# Patient Record
Sex: Female | Born: 1965 | Race: Black or African American | Hispanic: No | Marital: Single | Smoking: Former smoker
Health system: Southern US, Community
[De-identification: ages and names within clinical notes are randomized; demographics above are authoritative.]

---

## 2012-11-06 ENCOUNTER — Other Ambulatory Visit (HOSPITAL_BASED_OUTPATIENT_CLINIC_OR_DEPARTMENT_OTHER): Payer: Self-pay | Admitting: Orthopedic Surgery

## 2012-11-06 DIAGNOSIS — M25511 Pain in right shoulder: Secondary | ICD-10-CM

## 2012-11-15 ENCOUNTER — Ambulatory Visit (HOSPITAL_BASED_OUTPATIENT_CLINIC_OR_DEPARTMENT_OTHER): Payer: PRIVATE HEALTH INSURANCE

## 2012-11-15 ENCOUNTER — Ambulatory Visit (HOSPITAL_BASED_OUTPATIENT_CLINIC_OR_DEPARTMENT_OTHER)
Admission: RE | Admit: 2012-11-15 | Discharge: 2012-11-15 | Disposition: A | Discharge status: Home / Self Care | Payer: PRIVATE HEALTH INSURANCE | Source: Ambulatory Visit | Attending: Orthopedic Surgery | Admitting: Orthopedic Surgery

## 2012-11-15 DIAGNOSIS — M25511 Pain in right shoulder: Secondary | ICD-10-CM

## 2012-11-28 ENCOUNTER — Other Ambulatory Visit: Payer: PRIVATE HEALTH INSURANCE

## 2013-03-04 ENCOUNTER — Other Ambulatory Visit (HOSPITAL_BASED_OUTPATIENT_CLINIC_OR_DEPARTMENT_OTHER): Payer: Self-pay | Admitting: Orthopedic Surgery

## 2013-03-04 DIAGNOSIS — M25511 Pain in right shoulder: Secondary | ICD-10-CM

## 2013-03-04 DIAGNOSIS — M25552 Pain in left hip: Secondary | ICD-10-CM

## 2013-03-07 ENCOUNTER — Other Ambulatory Visit: Payer: PRIVATE HEALTH INSURANCE

## 2013-03-14 ENCOUNTER — Other Ambulatory Visit: Payer: PRIVATE HEALTH INSURANCE

## 2013-03-20 ENCOUNTER — Inpatient Hospital Stay: Admission: RE | Admit: 2013-03-20 | Payer: PRIVATE HEALTH INSURANCE | Source: Ambulatory Visit

## 2013-04-04 ENCOUNTER — Ambulatory Visit
Admission: RE | Admit: 2013-04-04 | Discharge: 2013-04-04 | Disposition: A | Discharge status: Home / Self Care | Payer: PRIVATE HEALTH INSURANCE | Source: Ambulatory Visit | Attending: Orthopedic Surgery | Admitting: Orthopedic Surgery

## 2013-04-04 ENCOUNTER — Ambulatory Visit
Admission: RE | Admit: 2013-04-04 | Discharge: 2013-04-04 | Disposition: A | Discharge status: Home / Self Care | Payer: PRIVATE HEALTH INSURANCE | Source: Ambulatory Visit

## 2013-04-04 ENCOUNTER — Ambulatory Visit: Admission: RE | Admit: 2013-04-04 | Payer: PRIVATE HEALTH INSURANCE | Source: Ambulatory Visit

## 2013-04-04 ENCOUNTER — Other Ambulatory Visit: Payer: Self-pay | Admitting: Orthopedic Surgery

## 2013-04-04 ENCOUNTER — Ambulatory Visit: Payer: PRIVATE HEALTH INSURANCE

## 2013-04-04 DIAGNOSIS — R52 Pain, unspecified: Secondary | ICD-10-CM

## 2013-04-04 DIAGNOSIS — M25511 Pain in right shoulder: Secondary | ICD-10-CM

## 2013-04-04 DIAGNOSIS — M25552 Pain in left hip: Secondary | ICD-10-CM

## 2013-04-04 NOTE — Progress Notes (Signed)
Dana Orozco had a difficult time getting double study done today.  She arrived an hour after requested arrival time, was claustrophobic, aggravated at physician for sending her and upset that her sister died recently.  Near the end of her study she started yelling so she was taken out of the scanner and sent home.

## 2013-06-24 ENCOUNTER — Ambulatory Visit (INDEPENDENT_AMBULATORY_CARE_PROVIDER_SITE_OTHER): Payer: PRIVATE HEALTH INSURANCE | Admitting: Podiatry

## 2013-06-24 ENCOUNTER — Encounter: Payer: Self-pay | Admitting: Podiatry

## 2013-06-24 VITALS — BP 145/99 | HR 95 | Ht 62.0 in | Wt 230.0 lb

## 2013-06-24 DIAGNOSIS — B351 Tinea unguium: Secondary | ICD-10-CM

## 2013-06-24 DIAGNOSIS — M79609 Pain in unspecified limb: Secondary | ICD-10-CM

## 2013-06-24 DIAGNOSIS — M79673 Pain in unspecified foot: Secondary | ICD-10-CM

## 2013-06-24 DIAGNOSIS — L851 Acquired keratosis [keratoderma] palmaris et plantaris: Secondary | ICD-10-CM

## 2013-06-24 NOTE — Patient Instructions (Signed)
Seen for painful feet. All calluses and nails debrided. Return in 3 months or as needed.

## 2013-06-24 NOTE — Progress Notes (Signed)
Subjective: 48 year old female presents complaining of pain on left foot and difficulty trimming her nails. She has right hip replacement done in 2008 and having difficulty bending down. Also had foot surgery done [facility] to have foot lesion removed. That area has callus build up.   Objective: Dermatologic: Thick keratotic lesion about 1.5cm in diameter with irregular border under the 5th MPJ area left foot.  Also has plantar broad callus under the ball of right foot.  Has old incision along the 5th Metatarsal shaft in dorsum left foot. Hypertrophic nails x 10.  Orthopedic: Rectus foot without gross deformity. Neurologic: All epicritic and tactile sensations grossly intact.   Assessment: Intractable porokeratosis sub 5 left. Onychomycosis x 10.  Plan: Reviewed the findings and debrided all calluses and nails. Patient will return in 3 months or as needed.

## 2013-08-04 ENCOUNTER — Encounter: Payer: Self-pay | Admitting: Podiatry

## 2013-08-04 ENCOUNTER — Ambulatory Visit (INDEPENDENT_AMBULATORY_CARE_PROVIDER_SITE_OTHER): Payer: PRIVATE HEALTH INSURANCE | Admitting: Podiatry

## 2013-08-04 VITALS — BP 141/95 | HR 78 | Ht 62.0 in | Wt 230.0 lb

## 2013-08-04 DIAGNOSIS — M79606 Pain in leg, unspecified: Secondary | ICD-10-CM

## 2013-08-04 DIAGNOSIS — M79609 Pain in unspecified limb: Secondary | ICD-10-CM

## 2013-08-04 DIAGNOSIS — B351 Tinea unguium: Secondary | ICD-10-CM

## 2013-08-04 DIAGNOSIS — L600 Ingrowing nail: Secondary | ICD-10-CM

## 2013-08-04 NOTE — Patient Instructions (Signed)
Seen for hypertrophic nails and painful calluses. All nails and calluses debrided. Return in 3 months or as needed.  

## 2013-08-04 NOTE — Progress Notes (Signed)
Subjective:  48 year old female presents complaining of pain on left foot and difficulty trimming her nails. She has right hip replacement done in 2008 and having difficulty bending down.  Having difficulty walking due to pain in left foot.   Objective: Dermatologic: Thick keratotic lesion about 1.5cm in diameter with irregular border under the 5th MPJ area left foot.  Also has plantar broad callus under the ball of right foot.  Hypertrophic nails x 10.  Orthopedic: Rectus foot without gross deformity.  Neurologic: All epicritic and tactile sensations grossly intact.   Assessment: Intractable porokeratosis sub 5 left.  Onychomycosis x 10.   Plan: Reviewed the findings and debrided all calluses and nails.  Patient will return in 3 months or as needed.

## 2013-10-12 ENCOUNTER — Encounter: Payer: Self-pay | Admitting: Podiatry

## 2013-10-12 ENCOUNTER — Ambulatory Visit (INDEPENDENT_AMBULATORY_CARE_PROVIDER_SITE_OTHER): Payer: PRIVATE HEALTH INSURANCE | Admitting: Podiatry

## 2013-10-12 VITALS — BP 118/77 | HR 89 | Ht 62.0 in | Wt 240.0 lb

## 2013-10-12 DIAGNOSIS — L600 Ingrowing nail: Secondary | ICD-10-CM

## 2013-10-12 DIAGNOSIS — L851 Acquired keratosis [keratoderma] palmaris et plantaris: Secondary | ICD-10-CM

## 2013-10-12 DIAGNOSIS — M79606 Pain in leg, unspecified: Secondary | ICD-10-CM

## 2013-10-12 DIAGNOSIS — M79609 Pain in unspecified limb: Secondary | ICD-10-CM

## 2013-10-12 DIAGNOSIS — B351 Tinea unguium: Secondary | ICD-10-CM

## 2013-10-12 NOTE — Progress Notes (Signed)
Subjective:  48 year old female presents complaining of pain on left foot under the mid foot area left foot and ball of right foot with thick painful callus and difficulty trimming her nails. Nails are thick and long hurting to wear closed in shoes.   Objective:  Dermatologic: Thick keratotic lesion about 1.5cm in diameter with irregular border under the 5th MPJ area left foot.  Also has plantar broad callus under the ball of right foot.  All nails are hypertrophic and painful x 10.  Orthopedic: Rectus foot without gross deformity.  Neurologic: All epicritic and tactile sensations grossly intact.   Assessment: Intractable porokeratosis sub 5 left.  Onychomycosis x 10.  Ingrown nails both great toes.   Plan: Reviewed the findings and debrided all calluses and nails.  Patient will return in 3 months or as needed.

## 2013-10-12 NOTE — Patient Instructions (Signed)
Seen for painful callus and nails. All debrided.

## 2013-11-04 ENCOUNTER — Ambulatory Visit: Payer: PRIVATE HEALTH INSURANCE | Admitting: Podiatry

## 2013-11-13 ENCOUNTER — Ambulatory Visit: Payer: PRIVATE HEALTH INSURANCE | Admitting: Podiatry

## 2013-11-13 ENCOUNTER — Ambulatory Visit (INDEPENDENT_AMBULATORY_CARE_PROVIDER_SITE_OTHER): Payer: PRIVATE HEALTH INSURANCE | Admitting: Podiatry

## 2013-11-13 DIAGNOSIS — L851 Acquired keratosis [keratoderma] palmaris et plantaris: Secondary | ICD-10-CM

## 2013-11-13 DIAGNOSIS — M79609 Pain in unspecified limb: Secondary | ICD-10-CM

## 2013-11-13 DIAGNOSIS — L600 Ingrowing nail: Secondary | ICD-10-CM

## 2013-11-13 DIAGNOSIS — B351 Tinea unguium: Secondary | ICD-10-CM

## 2013-11-13 DIAGNOSIS — M79606 Pain in leg, unspecified: Secondary | ICD-10-CM

## 2013-11-13 NOTE — Progress Notes (Signed)
Subjective:  48 year old female presents complaining of pain on 2nd toe nails both feet, left foot under the mid foot area left foot and ball of right foot with thick painful callus and difficulty trimming her nails.   Objective:  Dermatologic: Thick keratotic lesion about 1.5cm in diameter with irregular border under the 5th MPJ area left foot.  Also has plantar broad callus under the ball of right foot.  All nails are hypertrophic and painful x 10.  Orthopedic: Rectus foot without gross deformity. Long 2nd toe with pain in toe nails.  Neurologic: All epicritic and tactile sensations grossly intact.   Assessment: Intractable porokeratosis sub 5 left.  Onychomycosis x 10.  Ingrown nails both great toes.  Painful long and hammer toe 2nd bilateral.  Plan: Reviewed the findings and debrided all calluses and nails.  Patient will return in 3 months or as needed.

## 2013-11-13 NOTE — Patient Instructions (Signed)
Seen for painful nails and calluses. All debrided. Return as needed.

## 2014-01-01 ENCOUNTER — Encounter: Payer: Self-pay | Admitting: Podiatry

## 2014-01-01 ENCOUNTER — Ambulatory Visit (INDEPENDENT_AMBULATORY_CARE_PROVIDER_SITE_OTHER): Payer: PRIVATE HEALTH INSURANCE | Admitting: Podiatry

## 2014-01-01 VITALS — BP 140/80 | HR 91

## 2014-01-01 DIAGNOSIS — M79606 Pain in leg, unspecified: Secondary | ICD-10-CM

## 2014-01-01 DIAGNOSIS — L600 Ingrowing nail: Secondary | ICD-10-CM

## 2014-01-01 DIAGNOSIS — L851 Acquired keratosis [keratoderma] palmaris et plantaris: Secondary | ICD-10-CM

## 2014-01-01 DIAGNOSIS — M79609 Pain in unspecified limb: Secondary | ICD-10-CM

## 2014-01-01 DIAGNOSIS — B351 Tinea unguium: Secondary | ICD-10-CM

## 2014-01-01 NOTE — Patient Instructions (Signed)
Seen for hypertrophic nails and painful calluses. All nails and calluses debrided. Return in 2 months or as needed.  

## 2014-01-01 NOTE — Progress Notes (Signed)
Subjective:  48 year old female presents complaining of painful calluses and toe nails. They are painful and difficult to walk on.   Objective:  Dermatologic: Thick keratotic lesion about 1.5cm in diameter with irregular border under the 5th MPJ area left foot.  Also has plantar broad callus under the ball of right foot.  All nails are hypertrophic and painful x 10.  Orthopedic: Rectus foot without gross deformity. Long 2nd toe with pain in toe nails.  Neurologic: All epicritic and tactile sensations grossly intact.   Assessment: Intractable porokeratosis sub 5 left.  Onychomycosis x 10.  Painful long and hammer toe 2nd bilateral.   Plan: Reviewed the findings and debrided all calluses and nails.  Patient will return in 2 months or as needed.

## 2014-02-03 ENCOUNTER — Ambulatory Visit: Payer: PRIVATE HEALTH INSURANCE | Admitting: Podiatry

## 2014-02-03 ENCOUNTER — Encounter: Payer: Self-pay | Admitting: Podiatry

## 2014-02-03 ENCOUNTER — Ambulatory Visit (INDEPENDENT_AMBULATORY_CARE_PROVIDER_SITE_OTHER): Payer: PRIVATE HEALTH INSURANCE | Admitting: Podiatry

## 2014-02-03 VITALS — BP 140/84 | HR 93

## 2014-02-03 DIAGNOSIS — L851 Acquired keratosis [keratoderma] palmaris et plantaris: Secondary | ICD-10-CM

## 2014-02-03 DIAGNOSIS — M79609 Pain in unspecified limb: Secondary | ICD-10-CM

## 2014-02-03 DIAGNOSIS — M79606 Pain in leg, unspecified: Secondary | ICD-10-CM

## 2014-02-03 NOTE — Patient Instructions (Signed)
Seen for hypertrophic nails and painful calluses. All nails and calluses debrided. Return in 2 months or as needed.  

## 2014-02-03 NOTE — Progress Notes (Signed)
Subjective:  48 year old female presents complaining of painful calluses and toe nails. They are painful and difficult to walk on.  No new problems.   Objective:  Dermatologic: Thick keratotic lesion about 1.5cm in diameter with irregular border under the 5th MPJ area left foot.  Also has plantar broad callus under the ball of right foot.  All nails are hypertrophic and painful x 10.  Orthopedic: Rectus foot without gross deformity. Long 2nd toe with pain in toe nails.  Neurologic: All epicritic and tactile sensations grossly intact.   Assessment: Intractable porokeratosis sub 5 left.  Onychomycosis x 10.  Painful long and hammer toe 2nd bilateral.   Plan: Reviewed the findings and debrided all calluses and nails.  Patient will return in 2 months or as needed.

## 2014-03-03 ENCOUNTER — Ambulatory Visit: Payer: PRIVATE HEALTH INSURANCE | Admitting: Podiatry

## 2014-03-26 ENCOUNTER — Encounter: Payer: Self-pay | Admitting: Podiatry

## 2014-03-26 ENCOUNTER — Ambulatory Visit (INDEPENDENT_AMBULATORY_CARE_PROVIDER_SITE_OTHER): Payer: PRIVATE HEALTH INSURANCE | Admitting: Podiatry

## 2014-03-26 VITALS — BP 138/93 | HR 80

## 2014-03-26 DIAGNOSIS — B351 Tinea unguium: Secondary | ICD-10-CM

## 2014-03-26 DIAGNOSIS — M79606 Pain in leg, unspecified: Secondary | ICD-10-CM

## 2014-03-26 DIAGNOSIS — L851 Acquired keratosis [keratoderma] palmaris et plantaris: Secondary | ICD-10-CM

## 2014-03-26 DIAGNOSIS — L600 Ingrowing nail: Secondary | ICD-10-CM

## 2014-03-26 NOTE — Patient Instructions (Signed)
Seen for hypertrophic nails and calluses. All nails and calluses debrided. Return as needed.  

## 2014-03-26 NOTE — Progress Notes (Signed)
Subjective:  48 year old female presents complaining of painful calluses and toe nails.  When they are thick and grown in, feet hurt too much to walk.   Objective:  Dermatologic: Thick keratotic lesion about 1.5cm in diameter with irregular border under the 5th MPJ area left foot.  Also has plantar broad callus under the ball of right foot.  All nails are hypertrophic and painful x 10.  Orthopedic: Rectus foot without gross deformity. Long 2nd toe with pain in toe nails.  Neurologic: All epicritic and tactile sensations grossly intact.   Assessment: Intractable porokeratosis sub 5 left.  Onychomycosis x 10.  Painful long and hammer toe 2nd bilateral.   Plan: Reviewed the findings and debrided all calluses and nails.  Return as needed

## 2014-05-14 ENCOUNTER — Encounter: Payer: Self-pay | Admitting: Podiatry

## 2014-05-14 ENCOUNTER — Ambulatory Visit (INDEPENDENT_AMBULATORY_CARE_PROVIDER_SITE_OTHER): Payer: PRIVATE HEALTH INSURANCE | Admitting: Podiatry

## 2014-05-14 VITALS — BP 141/93 | HR 75

## 2014-05-14 DIAGNOSIS — L851 Acquired keratosis [keratoderma] palmaris et plantaris: Secondary | ICD-10-CM

## 2014-05-14 DIAGNOSIS — M79606 Pain in leg, unspecified: Secondary | ICD-10-CM

## 2014-05-14 DIAGNOSIS — B351 Tinea unguium: Secondary | ICD-10-CM

## 2014-05-14 NOTE — Progress Notes (Signed)
Subjective:  48 year old female presents complaining,  'Calluses are killing me!' Patient request painful calluses and toe nails trimmed.  When they are thick and grown in, feet hurt too much to walk.   Objective:  Dermatologic: Thick keratotic lesion about 1.5cm in diameter with irregular border under the 5th MPJ area left foot.  Also has plantar broad callus under the ball of right foot.  All nails are hypertrophic and painful x 10.  Orthopedic: Rectus foot without gross deformity. Long 2nd toe with pain in toe nails.  Neurologic: All epicritic and tactile sensations grossly intact.   Assessment: Intractable porokeratosis sub 5 left.  Onychomycosis x 10.  Painful long and hammer toe 2nd bilateral.   Plan: Reviewed the findings and debrided all calluses and nails.  Return as needed

## 2014-05-14 NOTE — Patient Instructions (Signed)
Seen for hypertrophic nails and calluses. All nails and calluses debrided. Return as needed.  

## 2014-07-21 ENCOUNTER — Encounter: Payer: Self-pay | Admitting: Podiatry

## 2014-07-21 ENCOUNTER — Ambulatory Visit (INDEPENDENT_AMBULATORY_CARE_PROVIDER_SITE_OTHER): Payer: Medicare Other | Admitting: Podiatry

## 2014-07-21 VITALS — BP 144/85 | HR 87

## 2014-07-21 DIAGNOSIS — B351 Tinea unguium: Secondary | ICD-10-CM

## 2014-07-21 DIAGNOSIS — L851 Acquired keratosis [keratoderma] palmaris et plantaris: Secondary | ICD-10-CM | POA: Diagnosis not present

## 2014-07-21 DIAGNOSIS — L97301 Non-pressure chronic ulcer of unspecified ankle limited to breakdown of skin: Secondary | ICD-10-CM

## 2014-07-21 DIAGNOSIS — M79606 Pain in leg, unspecified: Secondary | ICD-10-CM

## 2014-07-21 DIAGNOSIS — M79605 Pain in left leg: Secondary | ICD-10-CM

## 2014-07-21 NOTE — Progress Notes (Signed)
Subjective:  49 year old female presents complaining of painful calluses and requests painful calluses and toe nails trimmed.  They are too painful to wear regular shoes. She is on bedroom slippers.   Objective:  Dermatologic: Thick keratotic lesion about 1.5cm in diameter with irregular border under the 5th MPJ area left foot.  Also has plantar broad callus under the ball of right foot.  All nails are hypertrophic and painful x 10.  Orthopedic: Rectus foot without gross deformity. Long 2nd toe with pain in toe nails.  Neurologic: All epicritic and tactile sensations grossly intact.   Assessment: Intractable porokeratosis sub 5 left.  Onychomycosis x 10.  Painful long and hammer toe 2nd bilateral.   Plan: Reviewed the findings and debrided all calluses and nails.  Return as needed

## 2014-07-21 NOTE — Patient Instructions (Signed)
Seen for hypertrophic nails and painful calluses. All nails and calluses debrided. Return in 3 months or as needed.  

## 2014-10-05 ENCOUNTER — Ambulatory Visit (INDEPENDENT_AMBULATORY_CARE_PROVIDER_SITE_OTHER): Payer: Medicare Other | Admitting: Podiatry

## 2014-10-05 ENCOUNTER — Encounter: Payer: Self-pay | Admitting: Podiatry

## 2014-10-05 VITALS — BP 142/91 | HR 85

## 2014-10-05 DIAGNOSIS — M216X9 Other acquired deformities of unspecified foot: Secondary | ICD-10-CM

## 2014-10-05 DIAGNOSIS — L851 Acquired keratosis [keratoderma] palmaris et plantaris: Secondary | ICD-10-CM | POA: Diagnosis not present

## 2014-10-05 DIAGNOSIS — M79673 Pain in unspecified foot: Secondary | ICD-10-CM

## 2014-10-05 DIAGNOSIS — B351 Tinea unguium: Secondary | ICD-10-CM

## 2014-10-05 DIAGNOSIS — M79606 Pain in leg, unspecified: Secondary | ICD-10-CM

## 2014-10-05 NOTE — Patient Instructions (Signed)
Seen for hypertrophic nails and calluses. All nails and calluses debrided. Return as needed.  

## 2014-10-05 NOTE — Progress Notes (Signed)
Subjective:  49 year old female presents complaining of painful calluses and requests painful calluses and toe nails trimmed.  Patient wants to know if there is a surgical options for her foot condition.   Objective:  Dermatologic: Thick keratotic lesion under the 5th MPJ area left foot, very painful.  Also has plantar broad callus under 2nd and 3rd MPJ of right foot.  All nails are hypertrophic and painful x 10.  Orthopedic: Positive of excess dorsiflexion of the first ray bilateral L>R. Neurologic: All epicritic and tactile sensations grossly intact.   Assessment: Intractable porokeratosis sub 5 left.  Onychomycosis x 10.  Painful long and hammer toe 2nd bilateral.  Hypermobile/elevated first ray with lateral weight shifting L>R.  Plan: Reviewed the findings and debrided all calluses and nails.  May benefit from Cotton osteotomy with bone graft left. Also advised to use Custom or OTC orthotics as a conservative option along with periodic debridement.  Return as needed

## 2014-10-19 ENCOUNTER — Ambulatory Visit: Payer: Medicare Other | Admitting: Podiatry

## 2014-12-13 ENCOUNTER — Encounter: Payer: Self-pay | Admitting: Podiatry

## 2014-12-13 ENCOUNTER — Ambulatory Visit (INDEPENDENT_AMBULATORY_CARE_PROVIDER_SITE_OTHER): Payer: Medicare Other | Admitting: Podiatry

## 2014-12-13 VITALS — BP 143/93 | HR 86

## 2014-12-13 DIAGNOSIS — M79606 Pain in leg, unspecified: Secondary | ICD-10-CM

## 2014-12-13 DIAGNOSIS — L851 Acquired keratosis [keratoderma] palmaris et plantaris: Secondary | ICD-10-CM

## 2014-12-13 DIAGNOSIS — M21962 Unspecified acquired deformity of left lower leg: Secondary | ICD-10-CM

## 2014-12-13 DIAGNOSIS — M79673 Pain in unspecified foot: Secondary | ICD-10-CM

## 2014-12-13 NOTE — Progress Notes (Signed)
Subjective:  49 year old female presents complaining of painful calluses and requests painful calluses and toe nails trimmed.  Patient wants to discuss surgical options on her next visit.   Objective:  Dermatologic: Thick keratotic lesion under the 5th MPJ area left foot, very painful.  Also has plantar broad callus under 2nd and 3rd MPJ of right foot.  All nails are hypertrophic and painful x 10.  Orthopedic: Positive of excess dorsiflexion of the first ray bilateral L>R. Neurologic: All epicritic and tactile sensations grossly intact.   Assessment: Intractable porokeratosis sub 5 left.  Onychomycosis x 10.  Painful long and hammer toe 2nd bilateral.  Hypermobile/elevated first ray with lateral weight shifting L>R.  Plan: Reviewed the findings and debrided all calluses and nails.  May benefit from Cotton osteotomy with bone graft left. Also advised to use Custom or OTC orthotics as a conservative option along with periodic debridement.  Return for pre op discussion.

## 2015-01-28 ENCOUNTER — Ambulatory Visit (INDEPENDENT_AMBULATORY_CARE_PROVIDER_SITE_OTHER): Payer: Medicare Other | Admitting: Podiatry

## 2015-01-28 ENCOUNTER — Encounter: Payer: Self-pay | Admitting: Podiatry

## 2015-01-28 VITALS — BP 151/97 | HR 87

## 2015-01-28 DIAGNOSIS — M21962 Unspecified acquired deformity of left lower leg: Secondary | ICD-10-CM

## 2015-01-28 DIAGNOSIS — L851 Acquired keratosis [keratoderma] palmaris et plantaris: Secondary | ICD-10-CM | POA: Diagnosis not present

## 2015-01-28 DIAGNOSIS — B351 Tinea unguium: Secondary | ICD-10-CM

## 2015-01-28 DIAGNOSIS — L600 Ingrowing nail: Secondary | ICD-10-CM | POA: Diagnosis not present

## 2015-01-28 DIAGNOSIS — R234 Changes in skin texture: Secondary | ICD-10-CM

## 2015-01-28 DIAGNOSIS — M79606 Pain in leg, unspecified: Secondary | ICD-10-CM | POA: Diagnosis not present

## 2015-01-28 NOTE — Patient Instructions (Signed)
Debrided nails and calluses. Preop consent form reviewed for Cotton osteotomy with bone graft left foot.

## 2015-01-28 NOTE — Progress Notes (Signed)
Subjective:  49 year old female presents complaining of painful calluses and requests painful calluses and toe nails trimmed.  Patient request for sugical intervention for her painful foot left.   Objective:  Dermatologic: Thick keratotic lesion under the 5th MPJ area left foot, very painful.  Also has plantar broad callus under 2nd and 3rd MPJ of right foot.  All nails are hypertrophic and painful x 10.  Orthopedic: Positive of excess dorsiflexion of the first ray bilateral L>R. Neurologic: All epicritic and tactile sensations grossly intact.  Radiographic examination reveal short first metatarsal, elevated first metatarsal both feet.  Post surgical screw noted on the 5th metatarsal head left. No other acute changes noted.  Assessment: Intractable porokeratosis sub 5 left.  Onychomycosis x 10.  Painful long and hammer toe 2nd bilateral.  Hypermobile/elevated first ray with lateral weight shifting L>R.  Plan: Reviewed the findings and debrided all calluses and nails.  Pre op consent form reviewed for Cotton osteotomy with bone graft left.

## 2015-01-31 ENCOUNTER — Ambulatory Visit: Payer: Medicare Other | Admitting: Podiatry

## 2015-02-17 DIAGNOSIS — M216X9 Other acquired deformities of unspecified foot: Secondary | ICD-10-CM | POA: Diagnosis not present

## 2015-02-17 HISTORY — PX: OTHER SURGICAL HISTORY: SHX169

## 2015-02-22 ENCOUNTER — Ambulatory Visit (INDEPENDENT_AMBULATORY_CARE_PROVIDER_SITE_OTHER): Payer: Medicare Other | Admitting: Podiatry

## 2015-02-22 ENCOUNTER — Encounter: Payer: Self-pay | Admitting: Podiatry

## 2015-02-22 DIAGNOSIS — Z9889 Other specified postprocedural states: Secondary | ICD-10-CM

## 2015-02-22 NOTE — Patient Instructions (Signed)
5 days post op left foot walking well with a cane.  Continue to take easy on the left foot and minimize weight bearing.  Take medication as prescribed. Return in 2 weeks.

## 2015-02-22 NOTE — Progress Notes (Signed)
5 days post op cotton osteotomy with graft left foot. (02/17/15) Handling ok with cast and a cane.

## 2015-03-10 ENCOUNTER — Encounter: Payer: Self-pay | Admitting: Podiatry

## 2015-03-10 ENCOUNTER — Ambulatory Visit (INDEPENDENT_AMBULATORY_CARE_PROVIDER_SITE_OTHER): Payer: Medicare Other | Admitting: Podiatry

## 2015-03-10 VITALS — BP 127/87 | HR 73

## 2015-03-10 DIAGNOSIS — M79605 Pain in left leg: Secondary | ICD-10-CM | POA: Diagnosis not present

## 2015-03-10 DIAGNOSIS — M21962 Unspecified acquired deformity of left lower leg: Secondary | ICD-10-CM | POA: Diagnosis not present

## 2015-03-10 MED ORDER — HYDROCODONE-ACETAMINOPHEN 10-325 MG PO TABS: 1.0000 | ORAL_TABLET | Freq: Four times a day (QID) | ORAL | Status: DC | PRN

## 2015-03-10 NOTE — Patient Instructions (Signed)
3 weeks post op. Doing well. X-ray show good bone and graft position. Continue with light ambulation in cast. Return in 3 weeks.

## 2015-03-10 NOTE — Progress Notes (Signed)
3 weeks post op left foot surgery (02/17/15)

## 2015-03-14 ENCOUNTER — Ambulatory Visit (INDEPENDENT_AMBULATORY_CARE_PROVIDER_SITE_OTHER): Payer: Medicare Other | Admitting: Podiatry

## 2015-03-14 ENCOUNTER — Encounter: Payer: Self-pay | Admitting: Podiatry

## 2015-03-14 DIAGNOSIS — Z9889 Other specified postprocedural states: Secondary | ICD-10-CM

## 2015-03-14 NOTE — Patient Instructions (Addendum)
Seen for painful toe from rubbing on cast left foot. Noted of no skin damage. Repaired problematic cast.  Patient noted of relieved of pain. Return as scheduled.

## 2015-03-14 NOTE — Progress Notes (Signed)
Pain on top of toes from rubbing on the edge of cast. No broken skin or change in colors. No edema or erythema noted. Repaired problematic cast.  Patient noted of relieved of pain. Return as scheduled.

## 2015-03-31 ENCOUNTER — Encounter: Payer: Self-pay | Admitting: Podiatry

## 2015-03-31 ENCOUNTER — Ambulatory Visit (INDEPENDENT_AMBULATORY_CARE_PROVIDER_SITE_OTHER): Payer: Medicare Other | Admitting: Podiatry

## 2015-03-31 DIAGNOSIS — M21962 Unspecified acquired deformity of left lower leg: Secondary | ICD-10-CM | POA: Diagnosis not present

## 2015-03-31 DIAGNOSIS — Z9889 Other specified postprocedural states: Secondary | ICD-10-CM

## 2015-03-31 MED ORDER — HYDROCODONE-ACETAMINOPHEN 10-325 MG PO TABS: 1.0000 | ORAL_TABLET | Freq: Four times a day (QID) | ORAL | Status: DC | PRN

## 2015-03-31 NOTE — Addendum Note (Signed)
Addended by: Charlett NoseSHEARD, MYEONG O on: 03/31/2015 03:36 PM   Modules accepted: Orders

## 2015-03-31 NOTE — Patient Instructions (Signed)
Doing well. Continue semi weight bearing in CAM walker. Return in 3 weeks.

## 2015-03-31 NOTE — Progress Notes (Signed)
6 weeks post op Cotton osteotomy with bone graft left. Done well in cast. Denies any discomfort.  Cast removed. Wound healed well. Post op x-ray show no change in graft position. Placed in CAM walker. Instructed to do semi weight bearing.  Return in 3 weeks.

## 2015-04-20 ENCOUNTER — Ambulatory Visit (INDEPENDENT_AMBULATORY_CARE_PROVIDER_SITE_OTHER): Payer: Medicare Other | Admitting: Podiatry

## 2015-04-20 ENCOUNTER — Encounter: Payer: Self-pay | Admitting: Podiatry

## 2015-04-20 ENCOUNTER — Other Ambulatory Visit: Payer: Self-pay

## 2015-04-20 DIAGNOSIS — M21962 Unspecified acquired deformity of left lower leg: Secondary | ICD-10-CM | POA: Diagnosis not present

## 2015-04-20 DIAGNOSIS — M79606 Pain in leg, unspecified: Secondary | ICD-10-CM

## 2015-04-20 DIAGNOSIS — M79605 Pain in left leg: Secondary | ICD-10-CM

## 2015-04-20 DIAGNOSIS — B351 Tinea unguium: Secondary | ICD-10-CM

## 2015-04-20 MED ORDER — HYDROCODONE-ACETAMINOPHEN 10-325 MG PO TABS: 1.0000 | ORAL_TABLET | Freq: Four times a day (QID) | ORAL | Status: DC | PRN

## 2015-04-20 NOTE — Progress Notes (Signed)
2 month post op left foot Cotton osteotomy with graft (02/17/15). Had a toy dropped on her foot and been hurting with movement. Wants to be sure all things are ok.  Objective: Plantar callus under 5th MPJ left is thinning and decreasing in thickness and pain. All nails are hypertrophic. Right foot callus painful. Left foot callus has decreased in thickness and with minimum pain. Correction of the first metatarsal bone is maintained on left foot.   Post op X-ray show in lateral view grafted bone in place with positive signs of bone bridge.   Assessment: Satisfactory recovery. Painful onychomycosis and callus bilateral.  Plan:  Reviewed findings. Debrided all nails and calluses. Return as needed.

## 2015-04-20 NOTE — Patient Instructions (Signed)
Post op 2 month following bone graft. Wound healing normal with decreasing callus under 5th MPJ. Pain after dropping a toy. X-ray show normal finding with good bone healing. All nails and calluses debrided. Ok to wear regular shoes and return to normal activity as tolerated. Return as needed for nails and calluses.

## 2015-05-11 ENCOUNTER — Other Ambulatory Visit: Payer: Self-pay | Admitting: Orthopedic Surgery

## 2015-05-11 DIAGNOSIS — R52 Pain, unspecified: Secondary | ICD-10-CM

## 2015-05-24 ENCOUNTER — Other Ambulatory Visit: Payer: Medicaid Other

## 2015-05-24 ENCOUNTER — Ambulatory Visit
Admission: RE | Admit: 2015-05-24 | Discharge: 2015-05-24 | Disposition: A | Discharge status: Home / Self Care | Payer: Medicare Other | Source: Ambulatory Visit | Attending: Orthopedic Surgery | Admitting: Orthopedic Surgery

## 2015-05-24 DIAGNOSIS — R52 Pain, unspecified: Secondary | ICD-10-CM

## 2015-06-28 ENCOUNTER — Ambulatory Visit: Payer: Medicare Other | Admitting: Podiatry

## 2015-06-28 ENCOUNTER — Ambulatory Visit (INDEPENDENT_AMBULATORY_CARE_PROVIDER_SITE_OTHER): Payer: Medicare Other | Admitting: Podiatry

## 2015-06-28 ENCOUNTER — Encounter: Payer: Self-pay | Admitting: Podiatry

## 2015-06-28 DIAGNOSIS — B351 Tinea unguium: Secondary | ICD-10-CM

## 2015-06-28 DIAGNOSIS — M79606 Pain in leg, unspecified: Secondary | ICD-10-CM

## 2015-06-28 MED ORDER — HYDROCODONE-ACETAMINOPHEN 10-325 MG PO TABS: 1.0000 | ORAL_TABLET | Freq: Four times a day (QID) | ORAL | Status: DC | PRN

## 2015-06-28 NOTE — Patient Instructions (Signed)
Seen for hypertrophic nails and calluses. All nails and calluses debrided. Return in 3 months or as needed.  

## 2015-06-28 NOTE — Progress Notes (Signed)
Subjective:  50 year old female presents stating that she is doing better with the left foot since the surgery.  Stated that the surgery made a big improvement. The rain made her feet ache some.  Today patient wants calluses and nails trimmed.   Objective:  Dermatologic: Thick keratotic lesion under the 5th MPJ area left foot, painful but less than it used to be.  All nails are hypertrophic and painful x 10.  Orthopedic: Plantar flexed first ray left following the surgery. Neurologic: All epicritic and tactile sensations grossly intact.   Assessment: Improved first ray plantar flexion left following surgery. Onychomycosis x 10.  Painful callus under 5th MPJ left.   Plan: Reviewed the findings and debrided all calluses and nails.  Pain medication, Hydrocodone 5/325  prescribed as per request. Return as needed

## 2015-08-22 ENCOUNTER — Ambulatory Visit (INDEPENDENT_AMBULATORY_CARE_PROVIDER_SITE_OTHER): Payer: Medicare Other | Admitting: Podiatry

## 2015-08-22 ENCOUNTER — Encounter: Payer: Self-pay | Admitting: Podiatry

## 2015-08-22 ENCOUNTER — Ambulatory Visit: Payer: Medicare Other | Admitting: Podiatry

## 2015-08-22 VITALS — BP 145/84 | HR 79

## 2015-08-22 DIAGNOSIS — L851 Acquired keratosis [keratoderma] palmaris et plantaris: Secondary | ICD-10-CM | POA: Diagnosis not present

## 2015-08-22 DIAGNOSIS — M79606 Pain in leg, unspecified: Secondary | ICD-10-CM

## 2015-08-22 DIAGNOSIS — B351 Tinea unguium: Secondary | ICD-10-CM | POA: Diagnosis not present

## 2015-08-22 NOTE — Patient Instructions (Signed)
Seen for painful calluses and hypertrophic nails. All nails and calluses debrided. Return as needed.

## 2015-08-22 NOTE — Progress Notes (Signed)
Subjective:  50 year old female presents stating that she is doing better with the left foot since the surgery.  Stated that the surgery made a big improvement.  Today patient wants calluses and nails trimmed.   Objective:  Dermatologic: Thick keratotic lesion under the 5th MPJ area left foot, painful but less than it used to be.  All nails are hypertrophic and painful x 10.  Orthopedic: Plantar flexed first ray left following the surgery. Neurologic: All epicritic and tactile sensations grossly intact.   Assessment: Improved first ray plantar flexion left following surgery. Onychomycosis x 10.  Painful callus under 5th MPJ left.   Plan: Reviewed the findings and debrided all calluses and nails.  Return as needed

## 2015-09-22 ENCOUNTER — Telehealth: Payer: Self-pay | Admitting: *Deleted

## 2015-09-22 MED ORDER — HYDROCODONE-ACETAMINOPHEN 10-325 MG PO TABS: 1.0000 | ORAL_TABLET | Freq: Four times a day (QID) | ORAL | Status: DC | PRN

## 2015-09-22 NOTE — Telephone Encounter (Signed)
09/22/2015 Pt called and ask can she get a RX for pain, she will be coming in soon for her RFC appointment.

## 2015-10-06 ENCOUNTER — Encounter: Payer: Self-pay | Admitting: Podiatry

## 2015-10-06 ENCOUNTER — Ambulatory Visit (INDEPENDENT_AMBULATORY_CARE_PROVIDER_SITE_OTHER): Payer: Medicare Other | Admitting: Podiatry

## 2015-10-06 VITALS — BP 147/88 | HR 77

## 2015-10-06 DIAGNOSIS — M21962 Unspecified acquired deformity of left lower leg: Secondary | ICD-10-CM | POA: Diagnosis not present

## 2015-10-06 DIAGNOSIS — Q828 Other specified congenital malformations of skin: Secondary | ICD-10-CM

## 2015-10-06 DIAGNOSIS — M79605 Pain in left leg: Secondary | ICD-10-CM

## 2015-10-06 NOTE — Patient Instructions (Addendum)
Seen for hypertrophic nails and calluses. All nails and calluses debrided. Return in 2 months or as needed.  

## 2015-10-06 NOTE — Progress Notes (Signed)
Subjective:  50 year old female presents stating that she is doing better with the left foot since the surgery.  Stated that the surgery made a big improvement.  Today patient wants calluses and nails trimmed.   Objective:  Dermatologic: Thick keratotic lesion under the 5th MPJ area left foot, painful but less than it used to be.  All nails are hypertrophic and painful x 10.  Orthopedic: Plantar flexed first ray left following the surgery. Neurologic: All epicritic and tactile sensations grossly intact.   Assessment: Improved first ray plantar flexion left following surgery. Onychomycosis x 10.  Painful callus under 5th MPJ left.   Plan: Reviewed the findings and debrided all calluses and nails.  Return as needed

## 2015-12-08 ENCOUNTER — Ambulatory Visit (INDEPENDENT_AMBULATORY_CARE_PROVIDER_SITE_OTHER): Payer: Medicare Other | Admitting: Podiatry

## 2015-12-08 ENCOUNTER — Encounter: Payer: Self-pay | Admitting: Podiatry

## 2015-12-08 ENCOUNTER — Ambulatory Visit: Payer: Medicare Other | Admitting: Podiatry

## 2015-12-08 DIAGNOSIS — Q828 Other specified congenital malformations of skin: Secondary | ICD-10-CM | POA: Diagnosis not present

## 2015-12-08 DIAGNOSIS — M79606 Pain in leg, unspecified: Secondary | ICD-10-CM

## 2015-12-08 DIAGNOSIS — B351 Tinea unguium: Secondary | ICD-10-CM | POA: Diagnosis not present

## 2015-12-08 DIAGNOSIS — M21962 Unspecified acquired deformity of left lower leg: Secondary | ICD-10-CM

## 2015-12-08 MED ORDER — HYDROCODONE-ACETAMINOPHEN 10-325 MG PO TABS: 1.0000 | ORAL_TABLET | Freq: Four times a day (QID) | ORAL | Status: DC | PRN

## 2015-12-08 NOTE — Progress Notes (Signed)
Subjective:  50 year old female presents stating that she is doing better with the left foot since the surgery but still need them trimmed periodically and need more pain medication.   Objective:  Dermatologic: Thick keratotic lesion under the 5th MPJ area left foot, painful but less than it used to be.  All nails are hypertrophic and painful x 10.  Orthopedic: Plantar flexed first ray left following the surgery. Neurologic: All epicritic and tactile sensations grossly intact.   Assessment: Improved first ray plantar flexion left following surgery. Onychomycosis x 10.  Painful callus under 5th MPJ left.   Plan: Reviewed the findings and debrided all calluses and nails.  Return as needed

## 2015-12-08 NOTE — Patient Instructions (Signed)
Seen for hypertrophic nails and painful calluses. All nails and calluses debrided. Rx re ordered for pain. Return in 3 months or as needed.

## 2016-01-24 ENCOUNTER — Encounter: Payer: Self-pay | Admitting: Podiatry

## 2016-01-24 ENCOUNTER — Ambulatory Visit (INDEPENDENT_AMBULATORY_CARE_PROVIDER_SITE_OTHER): Payer: Medicare Other | Admitting: Podiatry

## 2016-01-24 DIAGNOSIS — Q828 Other specified congenital malformations of skin: Secondary | ICD-10-CM | POA: Diagnosis not present

## 2016-01-24 DIAGNOSIS — M79606 Pain in leg, unspecified: Secondary | ICD-10-CM

## 2016-01-24 DIAGNOSIS — B351 Tinea unguium: Secondary | ICD-10-CM

## 2016-01-24 MED ORDER — HYDROCODONE-ACETAMINOPHEN 10-325 MG PO TABS: 1.0000 | ORAL_TABLET | Freq: Four times a day (QID) | ORAL | 0 refills | Status: DC | PRN

## 2016-01-24 NOTE — Patient Instructions (Signed)
Seen for hypertrophic nails and painful calluses. All nails and calluses debrided. Return as needed.  

## 2016-01-24 NOTE — Progress Notes (Signed)
Subjective:  50 year old female presents stating that she is doing better with the left foot since the surgery but still need them trimmed periodically and need more pain medication. No new changes.   Objective:  Dermatologic: Thick keratotic lesion under the 5th MPJ area left foot, painful but less than it used to be.  All nails are hypertrophic and painful x 10.  Orthopedic: Plantar flexed first ray left following the surgery. Neurologic: All epicritic and tactile sensations grossly intact.   Assessment: Improved first ray plantar flexion left following surgery. Onychomycosis x 10.  Painful callus under 5th MPJ left.   Plan: Reviewed the findings and debrided all calluses and nails.  Return as needed

## 2016-03-08 ENCOUNTER — Encounter: Payer: Self-pay | Admitting: Podiatry

## 2016-03-08 ENCOUNTER — Ambulatory Visit (INDEPENDENT_AMBULATORY_CARE_PROVIDER_SITE_OTHER): Payer: Medicare Other | Admitting: Podiatry

## 2016-03-08 ENCOUNTER — Ambulatory Visit: Payer: Medicare Other | Admitting: Podiatry

## 2016-03-08 DIAGNOSIS — B351 Tinea unguium: Secondary | ICD-10-CM

## 2016-03-08 DIAGNOSIS — Q828 Other specified congenital malformations of skin: Secondary | ICD-10-CM

## 2016-03-08 DIAGNOSIS — M79673 Pain in unspecified foot: Secondary | ICD-10-CM | POA: Diagnosis not present

## 2016-03-08 DIAGNOSIS — M79606 Pain in leg, unspecified: Secondary | ICD-10-CM

## 2016-03-08 MED ORDER — HYDROCODONE-ACETAMINOPHEN 10-325 MG PO TABS: 1.0000 | ORAL_TABLET | Freq: Four times a day (QID) | ORAL | 0 refills | Status: DC | PRN

## 2016-03-08 NOTE — Progress Notes (Signed)
Subjective:  [age] year old female presents complaining of painful feet.  Patient wants to have the screws removed from left foot. She feels the pain is coming from the screw. She was having pain across the lesser MPJ left foot especially when weather is bad.  S/P 5th Metatarsal osteotomy with screw fixation 5th left done by another podiatrist. S/P Cotton osteotomy with bone graft (02/17/15) left to plantar flex medial column. Surgery helped reducing pain and callus build up under 5th MPJ left foot.   Objective:  Dermatologic:  Thick porokeratotic lesion with center eschar developed after having the lesion so many years under the 5th MPJ area left foot, painful but less extent than what it used to be. Able to function normal activity after periodic debridement.  All nails are hypertrophic and painful x 10.  No pain with palpation of screw site on left foot 5th Metatarsal head area. No abnormal findings noted on this post surgical site. Orthopedic: Plantar flexed first ray left following the surgery. Neurologic: All epicritic and tactile sensations grossly intact.   Assessment: Corrected first ray plantar flexion left following surgery. Onychomycosis x 10.  Plantar porokeratosis with eschar under 5th MPJ left.   Plan: Reviewed the findings and debrided all painful lesions and nails.  Explained her foot pain is not from the internal screw that was put in at the time of her previous surgery.  Re ordered Hydrocodone 10/325. Return as needed

## 2016-03-08 NOTE — Patient Instructions (Signed)
Seen for painful calluses and hypertrophic nails. All nails and calluses debrided. Pain medication re ordered. Return in 3 months or as needed.

## 2016-05-15 ENCOUNTER — Ambulatory Visit (INDEPENDENT_AMBULATORY_CARE_PROVIDER_SITE_OTHER): Payer: Medicare Other | Admitting: Podiatry

## 2016-05-15 ENCOUNTER — Encounter: Payer: Self-pay | Admitting: Podiatry

## 2016-05-15 VITALS — BP 148/89 | HR 93

## 2016-05-15 DIAGNOSIS — L851 Acquired keratosis [keratoderma] palmaris et plantaris: Secondary | ICD-10-CM | POA: Diagnosis not present

## 2016-05-15 DIAGNOSIS — M79671 Pain in right foot: Secondary | ICD-10-CM

## 2016-05-15 DIAGNOSIS — B351 Tinea unguium: Secondary | ICD-10-CM

## 2016-05-15 DIAGNOSIS — Q828 Other specified congenital malformations of skin: Secondary | ICD-10-CM | POA: Diagnosis not present

## 2016-05-15 DIAGNOSIS — M79672 Pain in left foot: Secondary | ICD-10-CM

## 2016-05-15 MED ORDER — HYDROCODONE-ACETAMINOPHEN 10-325 MG PO TABS: 1.0000 | ORAL_TABLET | Freq: Four times a day (QID) | ORAL | 0 refills | Status: DC | PRN

## 2016-05-15 NOTE — Progress Notes (Signed)
Subjective:  50 year old female presents complaining of painful feet. Nails and calluses are very thick and hurts to walk.  S/P 5th Metatarsal osteotomy with screw fixation 5th left done by another podiatrist. S/P Cotton osteotomy with bone graft (02/17/15) left to plantar flex medial column. Surgery helped reducing pain and callus build up under 5th MPJ left foot.   Objective:  Dermatologic:  Thick porokeratotic lesion with center eschar developed after having the lesion so many years under the 5th MPJ area left foot, painful but less extent than what it used to be. Able to function normal activity after periodic debridement.  All nails are hypertrophic and painful x 10.  No pain with palpation of screw site on left foot 5th Metatarsal head area. No abnormal findings noted on this post surgical site. Orthopedic: Plantar flexed first ray left following the surgery. Neurologic: All epicritic and tactile sensations grossly intact.   Assessment: Corrected first ray plantar flexion left following surgery. Onychomycosis x 10.  Plantar porokeratosis with eschar under 5th MPJ left.   Plan: Reviewed the findings and debrided all painful lesions and nails. Re ordered Hydrocodone 10/325. Return as needed

## 2016-05-15 NOTE — Patient Instructions (Addendum)
Seen for hypertrophic nails, painful calluses. All nails and calluses debrided. Pain medication prescribed as per request.  Return in 2 months or as needed.

## 2016-06-07 ENCOUNTER — Ambulatory Visit: Payer: Medicare Other | Admitting: Podiatry

## 2016-07-19 ENCOUNTER — Ambulatory Visit (INDEPENDENT_AMBULATORY_CARE_PROVIDER_SITE_OTHER): Payer: Medicare Other | Admitting: Podiatry

## 2016-07-19 DIAGNOSIS — B351 Tinea unguium: Secondary | ICD-10-CM

## 2016-07-19 DIAGNOSIS — M79672 Pain in left foot: Secondary | ICD-10-CM | POA: Diagnosis not present

## 2016-07-19 DIAGNOSIS — Q828 Other specified congenital malformations of skin: Secondary | ICD-10-CM

## 2016-07-19 DIAGNOSIS — M79671 Pain in right foot: Secondary | ICD-10-CM | POA: Diagnosis not present

## 2016-07-19 DIAGNOSIS — R234 Changes in skin texture: Secondary | ICD-10-CM

## 2016-07-19 MED ORDER — HYDROCODONE-ACETAMINOPHEN 10-325 MG PO TABS: 1.0000 | ORAL_TABLET | Freq: Two times a day (BID) | ORAL | 0 refills | Status: DC | PRN

## 2016-07-19 NOTE — Progress Notes (Signed)
Subjective:  [age]year old female presents complaining of painful feet. Nails and calluses are very thick and hurts to walk. No new problem since last visit.  S/P 5th Metatarsal osteotomy with screw fixation 5th left done by another podiatrist. S/P Cotton osteotomy with bone graft (02/17/15) left to plantar flex medial column. Surgery helped reducing pain and callus build up under 5th MPJ left foot.   Objective:  Dermatologic:  Thick porokeratotic lesion with center eschar developed after having the lesion so many yearsunder the 5th MPJ area left foot, painful but less extent than what itused to be. Able to function normal activity after periodic debridement.  All nails are hypertrophic and painful x 10.  No pain with palpation of screw site on left foot 5th Metatarsal head area. No abnormal findings noted on this post surgical site. Orthopedic: Plantar flexed first ray left following the surgery. Neurologic: All epicritic and tactile sensations grossly intact.   Assessment: Correctedfirst ray plantar flexion left following surgery. Onychomycosis x 10.  Plantar porokeratosis with eschar under 5th MPJ left.   Plan: Reviewed the findings and debrided all painful lesionsand nails. Re ordered Hydrocodone 10/325. Return as needed

## 2016-07-19 NOTE — Patient Instructions (Signed)
Seen for hypertrophic nails, and painful calluses. All nails and calluses debrided. Pain medication prescribed as per request. Return in 3 months or as needed.

## 2016-07-20 ENCOUNTER — Encounter: Payer: Self-pay | Admitting: Podiatry

## 2016-10-17 ENCOUNTER — Ambulatory Visit (INDEPENDENT_AMBULATORY_CARE_PROVIDER_SITE_OTHER): Payer: Medicare Other | Admitting: Podiatry

## 2016-10-17 ENCOUNTER — Encounter: Payer: Self-pay | Admitting: Podiatry

## 2016-10-17 DIAGNOSIS — M79672 Pain in left foot: Secondary | ICD-10-CM | POA: Diagnosis not present

## 2016-10-17 DIAGNOSIS — Q828 Other specified congenital malformations of skin: Secondary | ICD-10-CM

## 2016-10-17 DIAGNOSIS — M79671 Pain in right foot: Secondary | ICD-10-CM | POA: Diagnosis not present

## 2016-10-17 DIAGNOSIS — B351 Tinea unguium: Secondary | ICD-10-CM | POA: Diagnosis not present

## 2016-10-17 MED ORDER — HYDROCODONE-ACETAMINOPHEN 10-325 MG PO TABS: 1.0000 | ORAL_TABLET | Freq: Two times a day (BID) | ORAL | 0 refills | Status: DC | PRN

## 2016-10-17 NOTE — Patient Instructions (Signed)
Seen for hypertrophic nails and painful calluses. All nails and calluses debrided. Pain medication prescribed as per request. Return in 3 months or as needed.

## 2016-10-17 NOTE — Progress Notes (Signed)
Subjective:  [age]year old female presents complaining of painful feet. Nails and calluses are very thick and hurts to walk. Been on feet much for cooking and made her feet hurt more. Had to take two pain pills on that day. Any other days takes as needed.  S/P 5th Metatarsal osteotomy with screw fixation 5th left done by another podiatrist. S/P Cotton osteotomy with bone graft (02/17/15) left to plantar flex medial column. Surgery helped reducing pain and callus build up under 5th MPJ left foot.   Objective:  Dermatologic:  Thick porokeratotic lesion with center eschar developed after having the lesion so many yearsunder the 5th MPJ area left foot, painful but less extent than what itused to be. Able to function normal activity after periodic debridement.  All nails are hypertrophic and painful x 10.  No pain with palpation of screw site on left foot 5th Metatarsal head area. No abnormal findings noted on this post surgical site. Orthopedic: Plantar flexed first ray left following the surgery. Neurologic: All epicritic and tactile sensations grossly intact.   Assessment: Correctedfirst ray plantar flexion left following surgery. Onychomycosis x 10.  Plantar porokeratosis with eschar under 5th MPJ left.   Plan: Reviewed the findings and debrided all painful lesionsand nails.  Re ordered Hydrocodone 10/325. Return as needed

## 2016-12-20 ENCOUNTER — Ambulatory Visit (INDEPENDENT_AMBULATORY_CARE_PROVIDER_SITE_OTHER): Payer: Medicare Other | Admitting: Podiatry

## 2016-12-20 ENCOUNTER — Encounter: Payer: Self-pay | Admitting: Podiatry

## 2016-12-20 DIAGNOSIS — B351 Tinea unguium: Secondary | ICD-10-CM | POA: Diagnosis not present

## 2016-12-20 DIAGNOSIS — M79671 Pain in right foot: Secondary | ICD-10-CM

## 2016-12-20 DIAGNOSIS — R234 Changes in skin texture: Secondary | ICD-10-CM | POA: Diagnosis not present

## 2016-12-20 DIAGNOSIS — M79672 Pain in left foot: Secondary | ICD-10-CM

## 2016-12-20 DIAGNOSIS — Q828 Other specified congenital malformations of skin: Secondary | ICD-10-CM

## 2016-12-20 MED ORDER — HYDROCODONE-ACETAMINOPHEN 10-325 MG PO TABS: 1.0000 | ORAL_TABLET | Freq: Two times a day (BID) | ORAL | 0 refills | Status: DC | PRN

## 2016-12-20 NOTE — Patient Instructions (Signed)
Seen for painful lesions and hypertrophic nails. All nails and plantar calluses debrided. Pain medication prescribed. Return as needed.

## 2016-12-20 NOTE — Progress Notes (Signed)
Subjective:  [age]year old female presents complaining of painful feet. Nails and calluses are very thick and hurts to walk. Been on feet much for cooking and made her feet hurt more. Had to take two pain pills on that day. Any other days takes as needed.  S/P 5th Metatarsal osteotomy with screw fixation 5th left done by another podiatrist. S/P Cotton osteotomy with bone graft (02/17/15) left to plantar flex medial column. Surgery helped reducing pain and callus build up under 5th MPJ left foot.   Objective:  Dermatologic:  Thick porokeratotic lesion with center eschar developed after having the lesion so many yearsunder the 5th MPJ area left foot, painful but less extent than what itused to be. Able to function normal activity after periodic debridement.  All nails are hypertrophic and painful x 10.  No pain with palpation of screw site on left foot 5th Metatarsal head area. No abnormal findings noted on this post surgical site. Orthopedic: Plantar flexed first ray left following the surgery. Neurologic: All epicritic and tactile sensations grossly intact.   Assessment: Correctedfirst ray plantar flexion left following surgery. Onychomycosis x 10.  Plantar porokeratosis with eschar under 5th MPJ left.   Plan: Reviewed the findings and debrided all painful lesionsand nails.  Re ordered Hydrocodone 10/325. Return as needed

## 2017-02-12 ENCOUNTER — Ambulatory Visit (INDEPENDENT_AMBULATORY_CARE_PROVIDER_SITE_OTHER): Payer: Medicare Other | Admitting: Podiatry

## 2017-02-12 DIAGNOSIS — M79672 Pain in left foot: Secondary | ICD-10-CM | POA: Diagnosis not present

## 2017-02-12 DIAGNOSIS — L600 Ingrowing nail: Secondary | ICD-10-CM | POA: Diagnosis not present

## 2017-02-12 DIAGNOSIS — B351 Tinea unguium: Secondary | ICD-10-CM | POA: Diagnosis not present

## 2017-02-12 DIAGNOSIS — M79671 Pain in right foot: Secondary | ICD-10-CM

## 2017-02-12 MED ORDER — HYDROCODONE-ACETAMINOPHEN 10-325 MG PO TABS: 1.0000 | ORAL_TABLET | Freq: Two times a day (BID) | ORAL | 0 refills | Status: DC | PRN

## 2017-02-12 NOTE — Patient Instructions (Signed)
Seen for hypertrophic nails and painful calluses. All nails and calluses debrided. Pain medication prescribed. Return as needed.

## 2017-02-13 ENCOUNTER — Encounter: Payer: Self-pay | Admitting: Podiatry

## 2017-02-13 NOTE — Progress Notes (Signed)
Subjective:  [age]year old female presents complaining of painful feet. Both big toe nails have been hurting. Patient request painful lesions trimmed on left foot.   S/P 5th Metatarsal osteotomy with screw fixation 5th left done by another podiatrist. S/P Cotton osteotomy with bone graft (02/17/15) left to plantar flex medial column. Surgery helped reducing pain and callus build up under 5th MPJ left foot.   Objective:  Dermatologic:  Thick porokeratotic lesion with center eschar developed after having the lesion so many yearsunder the 5th MPJ area left foot, painful but less extent than what itused to be. Able to function normal activity after periodic debridement.  All nails are hypertrophic and painful x 10.  Painful ingrown nails both great toes without infection. No pain with palpation of screw site on left foot 5th Metatarsal head area. No abnormal findings noted on this post surgical site. Orthopedic: Plantar flexed first ray left following the surgery. Neurologic: All epicritic and tactile sensations grossly intact.   Assessment: Correctedfirst ray plantar flexion left following surgery. Onychomycosis x 10. Ingrown hallucal nails painful. Plantar porokeratosis with eschar under 5th MPJ left.   Plan: Reviewed the findings and debrided all painful lesionsand nails.  Re ordered Hydrocodone 10/325. Return as needed

## 2017-03-26 ENCOUNTER — Ambulatory Visit (INDEPENDENT_AMBULATORY_CARE_PROVIDER_SITE_OTHER): Payer: Medicare Other | Admitting: Podiatry

## 2017-03-26 ENCOUNTER — Encounter: Payer: Self-pay | Admitting: Podiatry

## 2017-03-26 DIAGNOSIS — L851 Acquired keratosis [keratoderma] palmaris et plantaris: Secondary | ICD-10-CM | POA: Diagnosis not present

## 2017-03-26 DIAGNOSIS — B351 Tinea unguium: Secondary | ICD-10-CM | POA: Diagnosis not present

## 2017-03-26 DIAGNOSIS — M79605 Pain in left leg: Secondary | ICD-10-CM | POA: Diagnosis not present

## 2017-03-26 DIAGNOSIS — M21962 Unspecified acquired deformity of left lower leg: Secondary | ICD-10-CM

## 2017-03-26 MED ORDER — HYDROCODONE-ACETAMINOPHEN 10-325 MG PO TABS: 1.0000 | ORAL_TABLET | Freq: Two times a day (BID) | ORAL | 0 refills | Status: DC | PRN

## 2017-03-26 NOTE — Progress Notes (Signed)
Subjective: 51 y.o. year old female patient presents complaining of painful feet. Feet start hurting whenever it start rain. Patient points plantar sub 5 area being the most painful area.  S/P 5th Metatarsal osteotomy with screw fixation 5th left done by another podiatrist. S/P Cotton osteotomy with bone graft (02/17/15) left to plantar flex medial column. Surgery helped reducing pain and callus build up under 5th MPJ left foot. Residual callus with eschar formation is less extent but still on going and eventually gets painful with weight bearing and daily activity.   Objective: Dermatologic: Thick yellow deformed nails x 10. Symptomatic plantar porokeratosis sub 5 left. Vascular: Pedal pulses are all palpable. Orthopedic: Varus deformity forefoot bilateral. Improved plantar flexion of the first ray left following the surgery. Neurologic: All epicritic and tactile sensations grossly intact.  Assessment: Dystrophic mycotic nails x 10. Painful porokeratosis sub 5 left. Residual forefoot varus left. Post surgical improved first ray left.  Treatment: All mycotic nails and painful calluses debrided.  Pain medication prescribed as per request. Return as needed.

## 2017-03-26 NOTE — Patient Instructions (Addendum)
Seen for hypertrophic nails and painful callus. All nails and painful lesions debrided. Return  as needed.

## 2017-05-30 ENCOUNTER — Ambulatory Visit (INDEPENDENT_AMBULATORY_CARE_PROVIDER_SITE_OTHER): Payer: Medicare Other | Admitting: Podiatry

## 2017-05-30 ENCOUNTER — Encounter: Payer: Self-pay | Admitting: Podiatry

## 2017-05-30 DIAGNOSIS — L851 Acquired keratosis [keratoderma] palmaris et plantaris: Secondary | ICD-10-CM | POA: Diagnosis not present

## 2017-05-30 DIAGNOSIS — R234 Changes in skin texture: Secondary | ICD-10-CM | POA: Diagnosis not present

## 2017-05-30 DIAGNOSIS — B351 Tinea unguium: Secondary | ICD-10-CM

## 2017-05-30 DIAGNOSIS — M79605 Pain in left leg: Secondary | ICD-10-CM

## 2017-05-30 MED ORDER — HYDROCODONE-ACETAMINOPHEN 10-325 MG PO TABS: 1.0000 | ORAL_TABLET | Freq: Two times a day (BID) | ORAL | 0 refills | Status: DC | PRN

## 2017-05-30 NOTE — Patient Instructions (Signed)
Seen for painful left foot lesion and hypertrophic nails. All nails and painful lesion debrided. Return as needed.

## 2017-05-30 NOTE — Progress Notes (Signed)
Subjective: 51 y.o. year old female patient presents complaining of pain in both feet. She feel on Monday and ful nails. Patient requests toe nails, corns and calluses trimmed. Her last visit to the office was 03/26/17.  S/P 5th Metatarsal osteotomy with screw fixation 5th left done by another podiatrist. S/P Cotton osteotomy with bone graft (02/17/15) left to plantar flex medial column. Surgery helped reducing pain and callus build up under 5th MPJ left foot.   Objective: Dermatologic: Thick yellow deformed nails x 10. Porokeratotic lesion with eschar sub 5 left foot painful with weight bearing. Vascular: Pedal pulses are all palpable. Orthopedic: Residual forefoot varus with lateral weight shifting left. Neurologic: All epicritic and tactile sensations grossly intact.  Assessment: Dystrophic mycotic nails x 10. Porokeratosis with eschar sub 5 left.  Treatment: All mycotic nails and calluses debrided.  As per request pain medication prescribed. Return as needed.

## 2017-07-29 ENCOUNTER — Other Ambulatory Visit: Payer: Self-pay | Admitting: Orthopedic Surgery

## 2017-07-29 DIAGNOSIS — M1712 Unilateral primary osteoarthritis, left knee: Secondary | ICD-10-CM

## 2017-07-29 DIAGNOSIS — G8929 Other chronic pain: Secondary | ICD-10-CM

## 2017-07-29 DIAGNOSIS — M2352 Chronic instability of knee, left knee: Secondary | ICD-10-CM

## 2017-07-29 DIAGNOSIS — M25569 Pain in unspecified knee: Principal | ICD-10-CM

## 2017-08-13 ENCOUNTER — Ambulatory Visit
Admission: RE | Admit: 2017-08-13 | Discharge: 2017-08-13 | Disposition: A | Discharge status: Home / Self Care | Payer: Medicare Other | Source: Ambulatory Visit | Attending: Orthopedic Surgery | Admitting: Orthopedic Surgery

## 2017-08-13 DIAGNOSIS — M25569 Pain in unspecified knee: Principal | ICD-10-CM

## 2017-08-13 DIAGNOSIS — M2352 Chronic instability of knee, left knee: Secondary | ICD-10-CM

## 2017-08-13 DIAGNOSIS — M1712 Unilateral primary osteoarthritis, left knee: Secondary | ICD-10-CM

## 2017-08-13 DIAGNOSIS — G8929 Other chronic pain: Secondary | ICD-10-CM

## 2017-08-22 ENCOUNTER — Encounter: Payer: Self-pay | Admitting: Podiatry

## 2017-08-22 ENCOUNTER — Ambulatory Visit (INDEPENDENT_AMBULATORY_CARE_PROVIDER_SITE_OTHER): Payer: Medicare Other | Admitting: Podiatry

## 2017-08-22 DIAGNOSIS — M21962 Unspecified acquired deformity of left lower leg: Secondary | ICD-10-CM

## 2017-08-22 DIAGNOSIS — L851 Acquired keratosis [keratoderma] palmaris et plantaris: Secondary | ICD-10-CM | POA: Diagnosis not present

## 2017-08-22 DIAGNOSIS — M79605 Pain in left leg: Secondary | ICD-10-CM | POA: Diagnosis not present

## 2017-08-22 MED ORDER — HYDROCODONE-ACETAMINOPHEN 10-325 MG PO TABS: 1.0000 | ORAL_TABLET | Freq: Two times a day (BID) | ORAL | 0 refills | Status: DC | PRN

## 2017-08-22 NOTE — Patient Instructions (Signed)
Seen for painful callus and hypertrophic nails. All nails and calluses debrided. 7 day supply pain medication prescribed. Return in 3 months or as needed.

## 2017-08-22 NOTE — Progress Notes (Signed)
Subjective: 52 y.o. year old female patient presents complaining of painful feet. Patient request painful calluses and nails trimmed. Also request for pain medication.  S/P 5th Metatarsal osteotomy with screw fixation 5th left done by another podiatrist. S/P Cotton osteotomy with bone graft (02/17/15) left to plantar flex medial column. Surgery helped reducing pain and callus build up under 5th MPJ left foot.  Objective: Dermatologic: Thick yellow deformed nails x 10. Plantar porokeratotic lesion sub 5 left. Vascular: Pedal pulses are all palpable. Orthopedic: Forefoot varus with lateral weight shifting. Neurologic: All epicritic and tactile sensations grossly intact.  Assessment: Dystrophic mycotic nails x 10. Porokeratosis with eschar sub 5 left painful.  Treatment: All mycotic nails, corns, calluses debrided.  As per request pain medication prescribed 7 day supply. Return in 3 months or as needed.

## 2017-11-13 ENCOUNTER — Ambulatory Visit (INDEPENDENT_AMBULATORY_CARE_PROVIDER_SITE_OTHER): Payer: Medicare Other | Admitting: Podiatry

## 2017-11-13 DIAGNOSIS — M79671 Pain in right foot: Secondary | ICD-10-CM | POA: Diagnosis not present

## 2017-11-13 DIAGNOSIS — M79672 Pain in left foot: Secondary | ICD-10-CM

## 2017-11-13 DIAGNOSIS — B351 Tinea unguium: Secondary | ICD-10-CM

## 2017-11-13 DIAGNOSIS — L600 Ingrowing nail: Secondary | ICD-10-CM

## 2017-11-13 MED ORDER — HYDROCODONE-ACETAMINOPHEN 10-325 MG PO TABS: 1.0000 | ORAL_TABLET | Freq: Four times a day (QID) | ORAL | 0 refills | Status: DC | PRN

## 2017-11-13 NOTE — Patient Instructions (Signed)
Seen for hypertrophic nails and painful calluses. All nails and calluses debrided. Pain medication prescribed as per request. Return in 3 months or as needed.  

## 2017-11-13 NOTE — Progress Notes (Signed)
Subjective: 52 y.o. year old female patient presents complaining of painful nails. Patient requests toe nails, corns and calluses trimmed.  S/P Left knee surgery September 04, 2017. Been through physical therapy. Still limping using cane.  Objective: Dermatologic: Thick yellow deformed nails x 10. Porokeratosis sub 5 left. Vascular: Pedal pulses are all palpable. Orthopedic: Contracted lesser digits  Neurologic: All epicritic and tactile sensations grossly intact.  Assessment: Dystrophic mycotic nails x 10. Porokeratosis left foot. Pain in left foot.  Treatment: All mycotic nails, corns, calluses debrided.  Return in 3 months or as needed.

## 2017-11-20 ENCOUNTER — Encounter: Payer: Self-pay | Admitting: Podiatry

## 2017-11-26 ENCOUNTER — Ambulatory Visit: Payer: Medicare Other | Admitting: Podiatry

## 2018-01-07 ENCOUNTER — Ambulatory Visit (INDEPENDENT_AMBULATORY_CARE_PROVIDER_SITE_OTHER): Payer: Medicare Other | Admitting: Podiatry

## 2018-01-07 DIAGNOSIS — B351 Tinea unguium: Secondary | ICD-10-CM

## 2018-01-07 DIAGNOSIS — M79672 Pain in left foot: Secondary | ICD-10-CM

## 2018-01-07 DIAGNOSIS — L851 Acquired keratosis [keratoderma] palmaris et plantaris: Secondary | ICD-10-CM | POA: Diagnosis not present

## 2018-01-07 DIAGNOSIS — M79671 Pain in right foot: Secondary | ICD-10-CM

## 2018-01-07 DIAGNOSIS — L600 Ingrowing nail: Secondary | ICD-10-CM | POA: Diagnosis not present

## 2018-01-07 DIAGNOSIS — Q828 Other specified congenital malformations of skin: Secondary | ICD-10-CM

## 2018-01-07 NOTE — Progress Notes (Signed)
Subjective: 52 y.o. year old female patient presents complaining of painful nails and a lesion under the 5th toe left foot. Stated that she is having problem with the left knee since the surgery and still using a cane to walk.  S/P Left knee surgery September 04, 2017. Been through physical therapy. Still limping using cane. History of 5th Metatarsal osteotomy with plantar lesion excision by another surgeon. S/P Cotton osteotomy left foot, 02/17/15.  Objective: Dermatologic: Thick dystrophic nails x 10. Ingrown hallucal nails on both big toes. Severe porokeratotic lesion under the 5th MPJ left foot painful. Vascular: Pedal pulses are all palpable. Orthopedic: Contracted lesser digits bilateral. Neurologic: All epicritic and tactile sensations grossly intact.  Assessment: Dystrophic mycotic nails x 10. Ingrown hallucal nails both big toes painful. Porokeratosis sub 5 left foot painful.  Treatment: All mycotic nails and ingrown nails debrided.  Painful plantar lesion debrided left foot.

## 2018-01-08 ENCOUNTER — Encounter: Payer: Self-pay | Admitting: Podiatry

## 2018-01-08 NOTE — Patient Instructions (Signed)
Seen for hypertrophic ingrown nails and painful lesions. All nails and painful lesion debrided. Return as needed.

## 2018-03-04 ENCOUNTER — Ambulatory Visit (INDEPENDENT_AMBULATORY_CARE_PROVIDER_SITE_OTHER): Payer: Medicare Other | Admitting: Podiatry

## 2018-03-04 ENCOUNTER — Encounter: Payer: Self-pay | Admitting: Podiatry

## 2018-03-04 DIAGNOSIS — M79672 Pain in left foot: Secondary | ICD-10-CM

## 2018-03-04 DIAGNOSIS — M21962 Unspecified acquired deformity of left lower leg: Secondary | ICD-10-CM | POA: Diagnosis not present

## 2018-03-04 DIAGNOSIS — L851 Acquired keratosis [keratoderma] palmaris et plantaris: Secondary | ICD-10-CM | POA: Diagnosis not present

## 2018-03-04 DIAGNOSIS — M79671 Pain in right foot: Secondary | ICD-10-CM

## 2018-03-04 MED ORDER — HYDROCODONE-ACETAMINOPHEN 10-325 MG PO TABS: 1.0000 | ORAL_TABLET | Freq: Four times a day (QID) | ORAL | 0 refills | Status: AC | PRN

## 2018-03-04 NOTE — Patient Instructions (Signed)
Seen for hypertrophic nails. All nails debrided. Return in 3 months or as needed.  

## 2018-03-04 NOTE — Progress Notes (Signed)
Subjective: 52 y.o. year old female patient presents complaining of painful nails. Patient requests toe nails, corns and calluses trimmed.   Objective: Dermatologic: Thick yellow deformed nails x 10. Vascular: Pedal pulses are all palpable. Orthopedic: Contracted lesser digits with deformed metatarsal bilateral. Neurologic: All epicritic and tactile sensations grossly intact.  Assessment: Dystrophic mycotic nails x 10. Porokeratosis with metatarsal deformity. Pain in both feet.  Treatment: All mycotic nails, corns, calluses debrided.  Return in 3 months or as needed.

## 2018-06-14 IMAGING — MR MR KNEE*L* W/O CM
4 of 5 series · 24 of 40 positions shown · non-contrast
Comparison: None.

CLINICAL DATA: Osteoarthritis of the left knee. Patient complains
of diffuse left knee pain for greater than 6 months with difficulty
standing, weakness and knee locking. Cortisone injection relieved
symptoms for 1 week.

EXAM:
MRI OF THE LEFT KNEE WITHOUT CONTRAST
TECHNIQUE: Multiplanar, multisequence MR imaging of the knee was performed. No
intravenous contrast was administered.

[Series 3: [id number]_fs_tra · axial · 4.0mm · 0.42mm/px · z∈[-27,+59]mm · 3 of 26 slices shown]
[im 4/26]
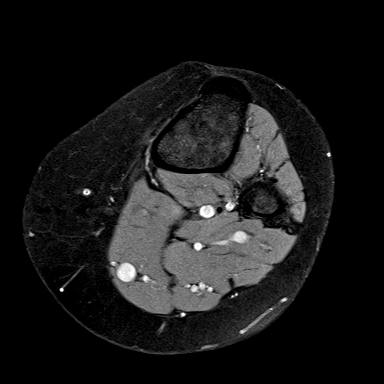
[im 15/26]
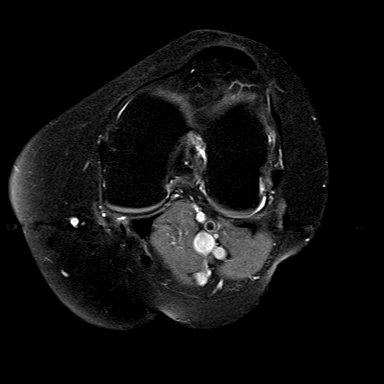
[im 22/26]
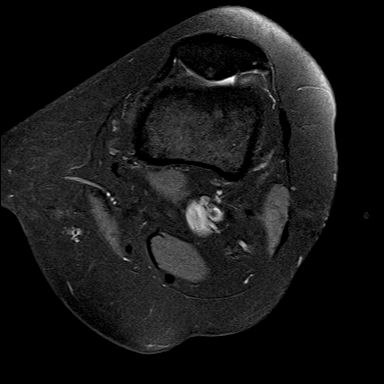

[Series 4: PD fat-sat · sagittal · 3.5mm · 0.31mm/px · 8 of 22 slices shown]
[im 1/22]
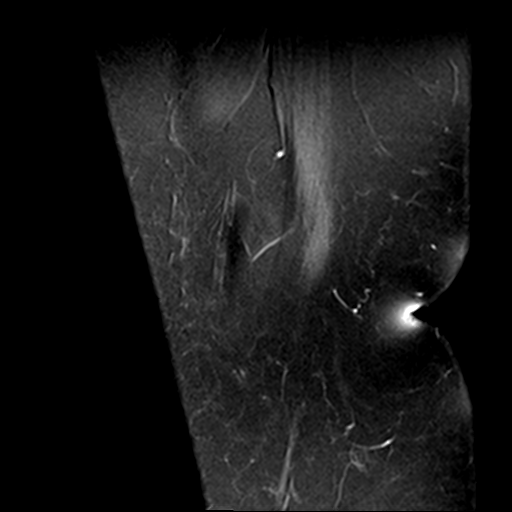
[im 4/22]
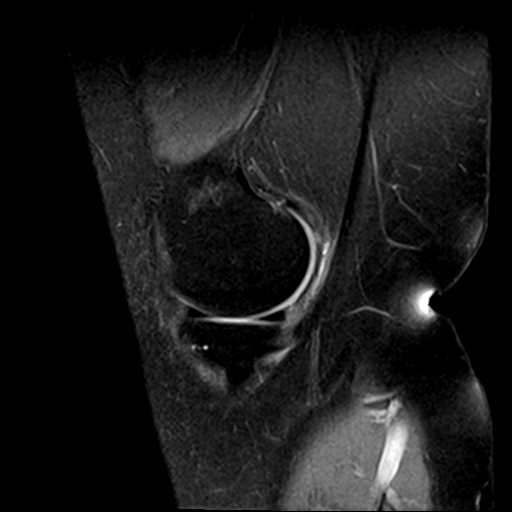
[im 7/22]
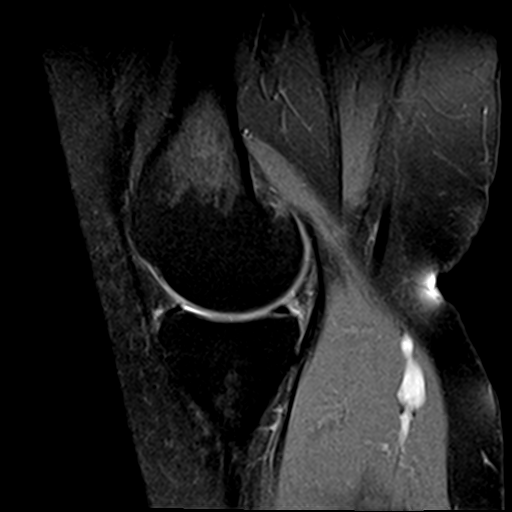
[im 10/22]
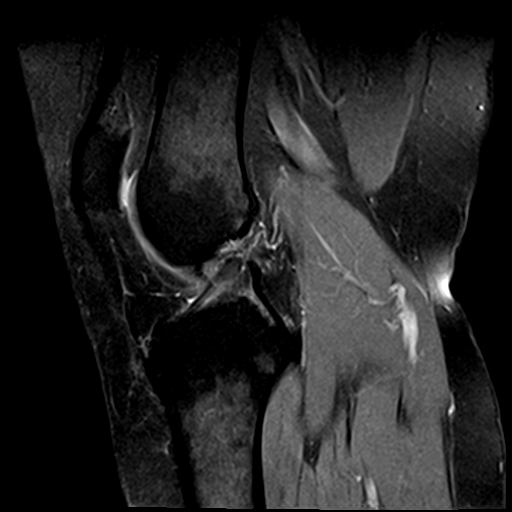
[im 13/22]
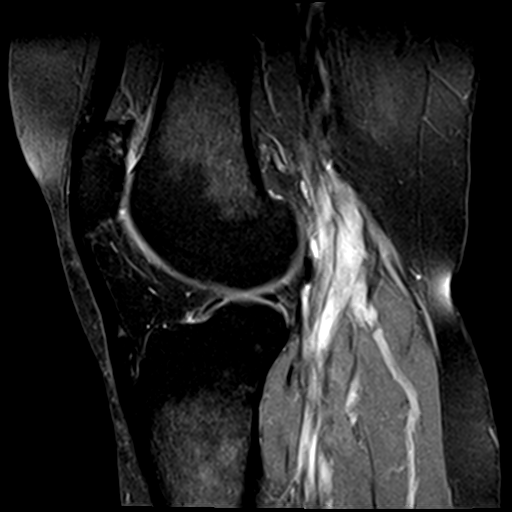
[im 16/22]
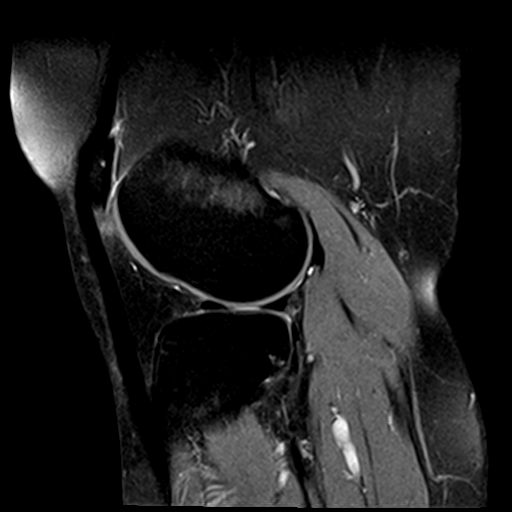
[im 19/22]
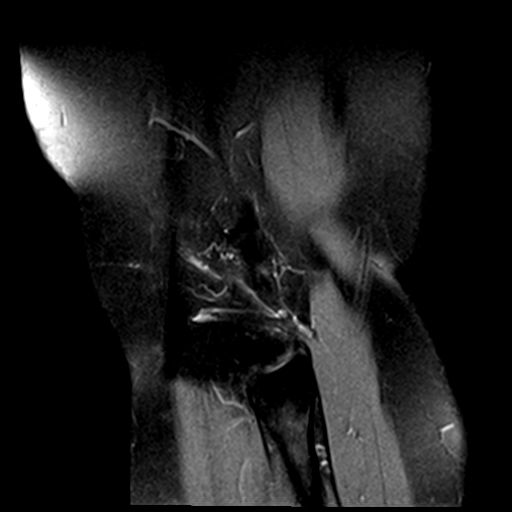
[im 22/22]
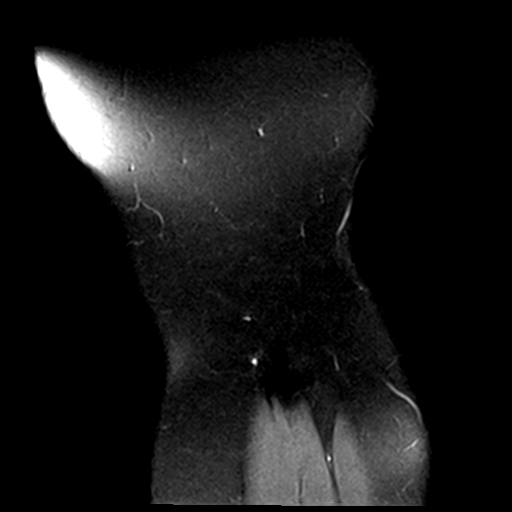

[Series 6: T2 fat-sat · coronal · 3.5mm · 0.62mm/px · 8 of 22 slices shown]
[im 1/22]
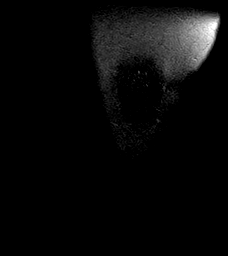
[im 4/22]
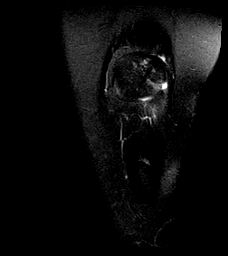
[im 7/22]
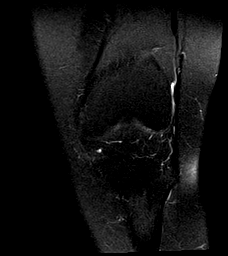
[im 10/22]
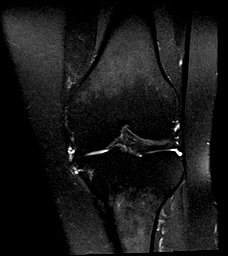
[im 13/22]
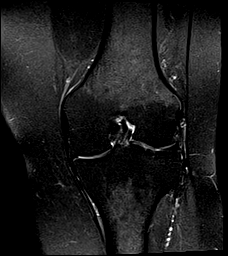
[im 16/22]
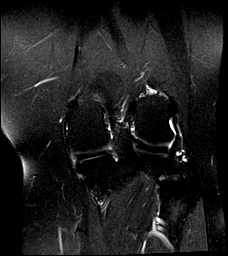
[im 19/22]
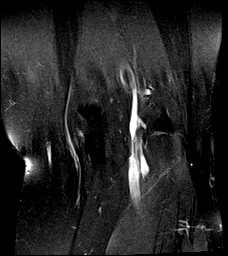
[im 22/22]
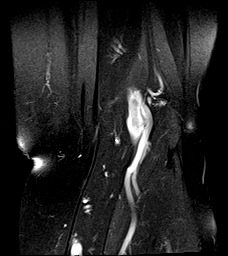

[Series 8: T1 · coronal · 3.5mm · 0.25mm/px · 5 of 22 slices shown]
[im 1/22]
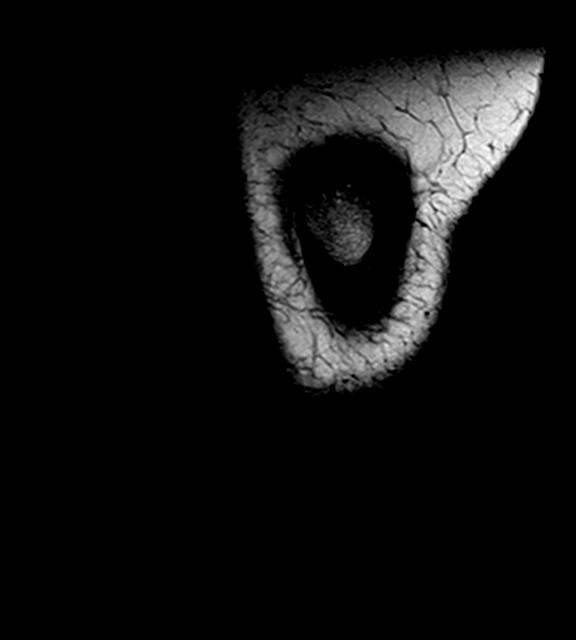
[im 4/22]
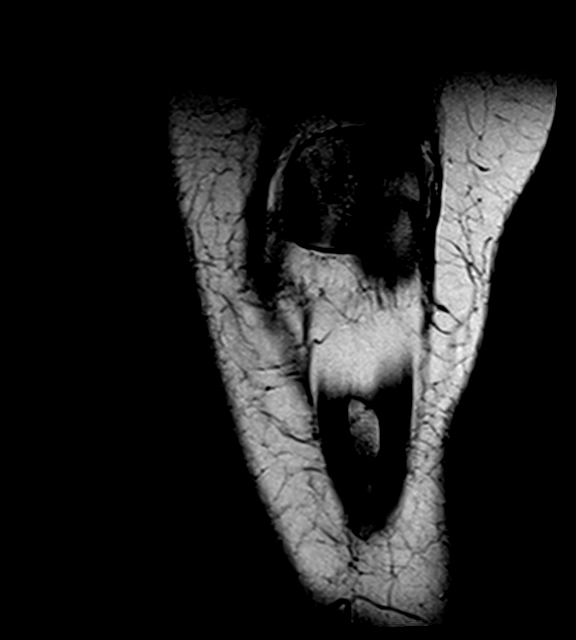
[im 7/22]
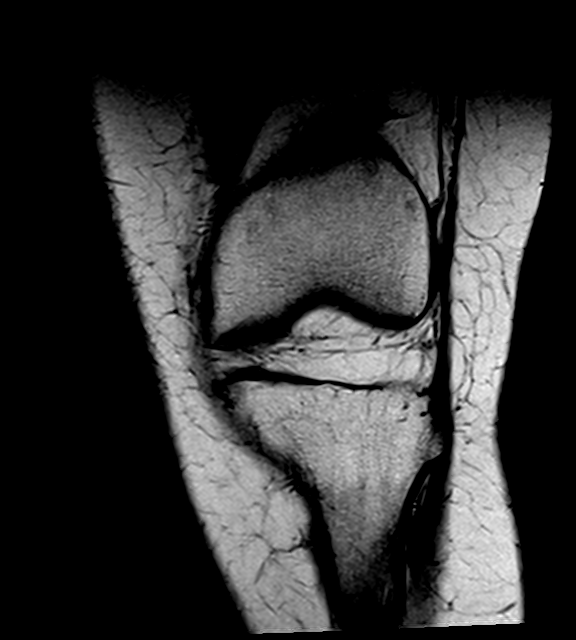
[im 13/22]
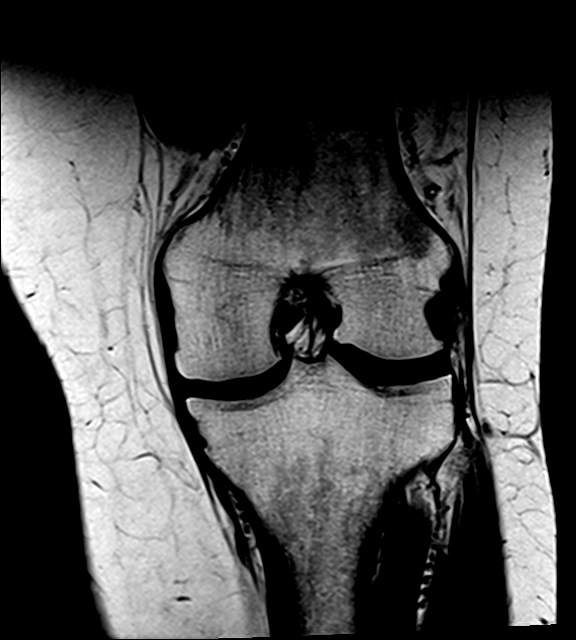
[im 19/22]
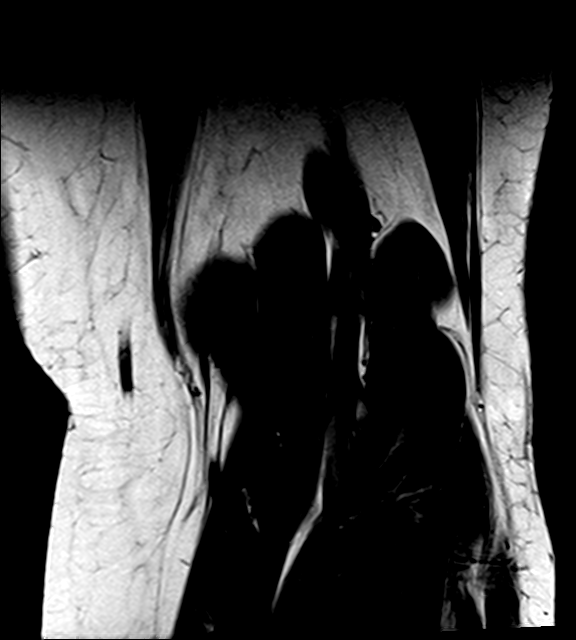

[24 of 40 positions shown; findings below may reference images not displayed]

FINDINGS: MENISCI

Medial meniscus: Minimal blunting of the body of the medial meniscus
compatible with a tiny free edge tear, series 5 images 10 and 11.

Lateral meniscus:  Intact

LIGAMENTS

Cruciates:  Intact ACL and PCL.

Collaterals: Medial collateral ligament is intact. Lateral
collateral ligament complex is intact.

CARTILAGE

Patellofemoral: Marked chondrosis with chondral thinning of the
lateral and medial patellar facet cartilage. Patellar subchondral
degenerative cystic change secondary to chondrosis is noted in the
upper pole, deep to the lateral patellar facet and median ridge.

Medial: Mild irregular partial-thickness cartilage loss of the
medial femorotibial compartment.

Lateral: Mild irregular partial thinning of the lateral femorotibial
cartilage. No focal chondral defect.

Joint:  No joint effusion. Normal Hoffa's fat. No plical thickening.

Popliteal Fossa:  No Baker cyst. Intact popliteus tendon.

Extensor Mechanism: Intact quadriceps tendon and patellar tendon.
Ossification off the tibial tuberosity compatible with old
Osgood-Schlatter's.

Bones: No focal marrow signal abnormality. No fracture or
dislocation.

Other: None
IMPRESSION: 1. Slight blunting of the body of the medial meniscus compatible
with a tiny free edge tear.
2. Intact cruciate and collateral ligaments.
3. Moderate to marked chondrosis of the patellofemoral compartment
with subchondral degenerate cystic change in the upper pole the
patella. There is slight irregular chondral thinning within the
femorotibial compartment.
4. Ossification off the tibial tuberosity compatible with old
Osgood-Schlatter.

## 2020-05-18 ENCOUNTER — Ambulatory Visit (INDEPENDENT_AMBULATORY_CARE_PROVIDER_SITE_OTHER): Payer: Medicare Other | Admitting: Podiatry

## 2020-05-18 ENCOUNTER — Encounter: Payer: Self-pay | Admitting: Podiatry

## 2020-05-18 ENCOUNTER — Other Ambulatory Visit: Payer: Self-pay

## 2020-05-18 DIAGNOSIS — M79674 Pain in right toe(s): Secondary | ICD-10-CM | POA: Diagnosis not present

## 2020-05-18 DIAGNOSIS — B351 Tinea unguium: Secondary | ICD-10-CM | POA: Diagnosis not present

## 2020-05-18 DIAGNOSIS — M79675 Pain in left toe(s): Secondary | ICD-10-CM | POA: Diagnosis not present

## 2020-05-18 NOTE — Progress Notes (Signed)
  Subjective:  Patient ID: Dana Orozco, female    DOB: November 16, 1965,  MRN: [id number]  Chief Complaint  Patient presents with  . routine foot care    Nail and callus trim. PT stated that her callous always throb and it is very painful when she walks    54 y.o. female returns for the above complaint.  Patient presents with thickened elongated dystrophic toenails x10.  Patient states that she has not been able to reach down because she has had hip surgery.  She would like for me to debride her toenails.  They are painful to touch.  Painful ambulating.  She denies any other acute complaints.  She is not a diabetic  Objective:  There were no vitals filed for this visit. Podiatric Exam: Vascular: dorsalis pedis and posterior tibial pulses are palpable bilateral. Capillary return is immediate. Temperature gradient is WNL. Skin turgor WNL  Sensorium: Normal Semmes Weinstein monofilament test. Normal tactile sensation bilaterally. Nail Exam: Pt has thick disfigured discolored nails with subungual debris noted bilateral entire nail hallux through fifth toenails.  Pain on palpation to the nails. Ulcer Exam: There is no evidence of ulcer or pre-ulcerative changes or infection. Orthopedic Exam: Muscle tone and strength are WNL. No limitations in general ROM. No crepitus or effusions noted. 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 Skin: No Porokeratosis. No infection or ulcers    Assessment & Plan:  No diagnosis found.  Patient was evaluated and treated and all questions answered.  Onychomycosis with pain  -Nails palliatively debrided as below. -Educated on self-care  Procedure: Nail Debridement Rationale: pain  Type of Debridement: manual, sharp debridement. Instrumentation: Nail nipper, rotary burr. Number of Nails: 10  Procedures and Treatment: Consent by patient was obtained for treatment procedures. The patient understood the discussion of treatment and procedures well. All questions were  answered thoroughly reviewed. Debridement of mycotic and hypertrophic toenails, 1 through 5 bilateral and clearing of subungual debris. No ulceration, no infection noted.  Return Visit-Office Procedure: Patient instructed to return to the office for a follow up visit 3 months for continued evaluation and treatment.  Nicholes Rough, DPM    No follow-ups on file.

## 2020-06-15 ENCOUNTER — Encounter: Payer: Self-pay | Admitting: Podiatry

## 2020-06-15 ENCOUNTER — Other Ambulatory Visit: Payer: Self-pay

## 2020-06-15 ENCOUNTER — Ambulatory Visit (INDEPENDENT_AMBULATORY_CARE_PROVIDER_SITE_OTHER): Payer: Medicare Other | Admitting: Podiatry

## 2020-06-15 DIAGNOSIS — Q828 Other specified congenital malformations of skin: Secondary | ICD-10-CM | POA: Diagnosis not present

## 2020-06-15 DIAGNOSIS — M79674 Pain in right toe(s): Secondary | ICD-10-CM

## 2020-06-15 DIAGNOSIS — M79675 Pain in left toe(s): Secondary | ICD-10-CM

## 2020-06-15 DIAGNOSIS — B351 Tinea unguium: Secondary | ICD-10-CM | POA: Diagnosis not present

## 2020-06-15 NOTE — Progress Notes (Signed)
  Subjective:  Patient ID: Dana MARCHESE, female    DOB: 25-Sep-1965,  MRN: [id number]  Chief Complaint  Patient presents with  . debridement    BL nails and callus trimming -per pt calluses are very sore and hard to walk Tx: epsom salt soaking    55 y.o. female returns for the above complaint.  Patient presents with thickened elongated dystrophic toenails x10.  Patient states that she has not been able to reach down because she has had hip surgery.  She would like for me to debride her toenails.  They are painful to touch.  Painful ambulating.  She states that she also has pain with porokeratosis to the left submetatarsal 5.  She would like to debride down.  She denies any other acute complaints.  Objective:  There were no vitals filed for this visit. Podiatric Exam: Vascular: dorsalis pedis and posterior tibial pulses are palpable bilateral. Capillary return is immediate. Temperature gradient is WNL. Skin turgor WNL  Sensorium: Normal Semmes Weinstein monofilament test. Normal tactile sensation bilaterally. Nail Exam: Pt has thick disfigured discolored nails with subungual debris noted bilateral entire nail hallux through fifth toenails.  Pain on palpation to the nails. Ulcer Exam: There is no evidence of ulcer or pre-ulcerative changes or infection. Orthopedic Exam: Muscle tone and strength are WNL. No limitations in general ROM. No crepitus or effusions noted. 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 Skin: Left's submetatarsal 5 porokeratosis. No infection or ulcers    Assessment & Plan:  No diagnosis found.  Patient was evaluated and treated and all questions answered.  Left submetatarsal 5 porokeratosis -I explained to the patient the etiology of porokeratosis versus treatment options were discussed.  Given the amount of pain that she is having I believe patient would benefit from aggressive debridement of the lesion followed by excision of the central nucleated core.  Patient agrees with  the plan would like to proceed with that -Using chisel blade to handle the lesion was debrided down to healthy striated tissue no pinpoint bleeding noted no complication noted followed by excision of the central nucleated core.  Onychomycosis with pain  -Nails palliatively debrided as below. -Educated on self-care  Procedure: Nail Debridement Rationale: pain  Type of Debridement: manual, sharp debridement. Instrumentation: Nail nipper, rotary burr. Number of Nails: 10  Procedures and Treatment: Consent by patient was obtained for treatment procedures. The patient understood the discussion of treatment and procedures well. All questions were answered thoroughly reviewed. Debridement of mycotic and hypertrophic toenails, 1 through 5 bilateral and clearing of subungual debris. No ulceration, no infection noted.  Return Visit-Office Procedure: Patient instructed to return to the office for a follow up visit 3 months for continued evaluation and treatment.  Nicholes Rough, DPM    No follow-ups on file.

## 2020-08-10 ENCOUNTER — Other Ambulatory Visit: Payer: Self-pay

## 2020-08-10 ENCOUNTER — Ambulatory Visit (INDEPENDENT_AMBULATORY_CARE_PROVIDER_SITE_OTHER): Payer: Medicare Other | Admitting: Podiatry

## 2020-08-10 DIAGNOSIS — M79675 Pain in left toe(s): Secondary | ICD-10-CM | POA: Diagnosis not present

## 2020-08-10 DIAGNOSIS — B351 Tinea unguium: Secondary | ICD-10-CM

## 2020-08-10 DIAGNOSIS — Q828 Other specified congenital malformations of skin: Secondary | ICD-10-CM

## 2020-08-10 DIAGNOSIS — M79674 Pain in right toe(s): Secondary | ICD-10-CM | POA: Diagnosis not present

## 2020-08-12 ENCOUNTER — Encounter: Payer: Self-pay | Admitting: Podiatry

## 2020-08-12 NOTE — Progress Notes (Signed)
  Subjective:  Patient ID: Dana Orozco, female    DOB: 1965/10/27,  MRN: [id number]  Chief Complaint  Patient presents with  . Callouses    Left foot callus    55 y.o. female returns for the above complaint.  Patient presents with thickened elongated dystrophic toenails x10.  Patient states that she has not been able to reach down because she has had hip surgery.  She would like for me to debride her toenails.  They are painful to touch.  Painful ambulating.  She states that she also has pain with porokeratosis to the left submetatarsal 5.  She would like to debride down.  She denies any other acute complaints.  Patient is also going to [place] and would like to have this not causing any pain.  Objective:  There were no vitals filed for this visit. Podiatric Exam: Vascular: dorsalis pedis and posterior tibial pulses are palpable bilateral. Capillary return is immediate. Temperature gradient is WNL. Skin turgor WNL  Sensorium: Normal Semmes Weinstein monofilament test. Normal tactile sensation bilaterally. Nail Exam: Pt has thick disfigured discolored nails with subungual debris noted bilateral entire nail hallux through fifth toenails.  Pain on palpation to the nails. Ulcer Exam: There is no evidence of ulcer or pre-ulcerative changes or infection. Orthopedic Exam: Muscle tone and strength are WNL. No limitations in general ROM. No crepitus or effusions noted. 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 Skin: Left's submetatarsal 5 porokeratosis. No infection or ulcers    Assessment & Plan:   1. Pain due to onychomycosis of toenails of both feet   2. Porokeratosis     Patient was evaluated and treated and all questions answered.  Left submetatarsal 5 porokeratosis -I explained to the patient the etiology of porokeratosis versus treatment options were discussed.  Given the amount of pain that she is having I believe patient would benefit from aggressive debridement of the lesion followed by  excision of the central nucleated core.  Patient agrees with the plan would like to proceed with that -Using chisel blade to handle the lesion was debrided down to healthy striated tissue no pinpoint bleeding noted no complication noted followed by excision of the central nucleated core.  Onychomycosis with pain  -Nails palliatively debrided as below. -Educated on self-care  Procedure: Nail Debridement Rationale: pain  Type of Debridement: manual, sharp debridement. Instrumentation: Nail nipper, rotary burr. Number of Nails: 10  Procedures and Treatment: Consent by patient was obtained for treatment procedures. The patient understood the discussion of treatment and procedures well. All questions were answered thoroughly reviewed. Debridement of mycotic and hypertrophic toenails, 1 through 5 bilateral and clearing of subungual debris. No ulceration, no infection noted.  Return Visit-Office Procedure: Patient instructed to return to the office for a follow up visit 3 months for continued evaluation and treatment.  Nicholes Rough, DPM    No follow-ups on file.

## 2020-09-28 ENCOUNTER — Encounter: Payer: Self-pay | Admitting: Podiatry

## 2020-09-28 ENCOUNTER — Other Ambulatory Visit: Payer: Self-pay

## 2020-09-28 ENCOUNTER — Ambulatory Visit (INDEPENDENT_AMBULATORY_CARE_PROVIDER_SITE_OTHER): Payer: Medicare Other | Admitting: Podiatry

## 2020-09-28 DIAGNOSIS — M79674 Pain in right toe(s): Secondary | ICD-10-CM

## 2020-09-28 DIAGNOSIS — B351 Tinea unguium: Secondary | ICD-10-CM

## 2020-09-28 DIAGNOSIS — Q828 Other specified congenital malformations of skin: Secondary | ICD-10-CM | POA: Diagnosis not present

## 2020-09-28 DIAGNOSIS — M79675 Pain in left toe(s): Secondary | ICD-10-CM | POA: Diagnosis not present

## 2020-09-28 NOTE — Progress Notes (Signed)
  Subjective:  Patient ID: Dana Orozco, female    DOB: Oct 02, 1965,  MRN: [id number]  Chief Complaint  Patient presents with  . Foot Pain     L foot pain, pt unable to walk   55 y.o. female returns for the above complaint.  Patient presents with thickened elongated dystrophic toenails x10.  Patient states that she has not been able to reach down because she has had hip surgery.  She would like for me to debride her toenails.  They are painful to touch.  Painful ambulating.  She states that she also has pain with porokeratosis to the left submetatarsal 5.  She would like to debride down.  She denies any other acute complaints.  Objective:  There were no vitals filed for this visit. Podiatric Exam: Vascular: dorsalis pedis and posterior tibial pulses are palpable bilateral. Capillary return is immediate. Temperature gradient is WNL. Skin turgor WNL  Sensorium: Normal Semmes Weinstein monofilament test. Normal tactile sensation bilaterally. Nail Exam: Pt has thick disfigured discolored nails with subungual debris noted bilateral entire nail hallux through fifth toenails.  Pain on palpation to the nails. Ulcer Exam: There is no evidence of ulcer or pre-ulcerative changes or infection. Orthopedic Exam: Muscle tone and strength are WNL. No limitations in general ROM. No crepitus or effusions noted. 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 Skin: Left's submetatarsal 5 porokeratosis with underlying plantarflexed metatarsal of the fifth. No infection or ulcers    Assessment & Plan:   1. Pain due to onychomycosis of toenails of both feet   2. Porokeratosis     Patient was evaluated and treated and all questions answered.  Left submetatarsal 5 porokeratosis -I explained to the patient the etiology of porokeratosis versus treatment options were discussed.  Given the amount of pain that she is having I believe patient would benefit from aggressive debridement of the lesion followed by excision of the  central nucleated core.  Patient agrees with the plan would like to proceed with that -Using chisel blade to handle the lesion was debrided down to healthy striated tissue no pinpoint bleeding noted no complication noted followed by excision of the central nucleated core. -She will think about surgery sometime in October.  Onychomycosis with pain  -Nails palliatively debrided as below. -Educated on self-care  Procedure: Nail Debridement Rationale: pain  Type of Debridement: manual, sharp debridement. Instrumentation: Nail nipper, rotary burr. Number of Nails: 10  Procedures and Treatment: Consent by patient was obtained for treatment procedures. The patient understood the discussion of treatment and procedures well. All questions were answered thoroughly reviewed. Debridement of mycotic and hypertrophic toenails, 1 through 5 bilateral and clearing of subungual debris. No ulceration, no infection noted.  Return Visit-Office Procedure: Patient instructed to return to the office for a follow up visit 3 months for continued evaluation and treatment.  Nicholes Rough, DPM    No follow-ups on file.

## 2020-11-11 ENCOUNTER — Ambulatory Visit: Payer: Medicare Other | Admitting: Podiatry

## 2020-11-23 ENCOUNTER — Ambulatory Visit (INDEPENDENT_AMBULATORY_CARE_PROVIDER_SITE_OTHER): Payer: Medicare Other

## 2020-11-23 ENCOUNTER — Other Ambulatory Visit: Payer: Self-pay

## 2020-11-23 ENCOUNTER — Ambulatory Visit (INDEPENDENT_AMBULATORY_CARE_PROVIDER_SITE_OTHER): Payer: Medicare Other | Admitting: Podiatry

## 2020-11-23 DIAGNOSIS — M216X2 Other acquired deformities of left foot: Secondary | ICD-10-CM | POA: Diagnosis not present

## 2020-11-23 DIAGNOSIS — Q828 Other specified congenital malformations of skin: Secondary | ICD-10-CM | POA: Diagnosis not present

## 2020-11-23 DIAGNOSIS — Z01818 Encounter for other preprocedural examination: Secondary | ICD-10-CM

## 2020-11-29 ENCOUNTER — Encounter: Payer: Self-pay | Admitting: Podiatry

## 2020-11-29 NOTE — Progress Notes (Signed)
  Subjective:  Patient ID: Dana Orozco, female    DOB: 10-12-1965,  MRN: [id number]  Chief Complaint  Patient presents with   Nail Problem    Nail/callus trim    55 y.o. female returns for the above complaint.  Patient presents with follow-up of plantarflexed metatarsal to the left side with submetatarsal 5 porokeratosis.  She states is still continues to hurt has not gotten any better she would like to discuss surgical options for this.  She is finally ready for surgery.    Objective:  There were no vitals filed for this visit. Podiatric Exam: Vascular: dorsalis pedis and posterior tibial pulses are palpable bilateral. Capillary return is immediate. Temperature gradient is WNL. Skin turgor WNL  Sensorium: Normal Semmes Weinstein monofilament test. Normal tactile sensation bilaterally. Nail Exam: Pt has thick disfigured discolored nails with subungual debris noted bilateral entire nail hallux through fifth toenails.  Pain on palpation to the nails. Ulcer Exam: There is no evidence of ulcer or pre-ulcerative changes or infection. Orthopedic Exam: Muscle tone and strength are WNL. No limitations in general ROM. No crepitus or effusions noted. 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 Skin: Left's submetatarsal 5 porokeratosis with underlying plantarflexed metatarsal of the fifth. No infection or ulcers    Assessment & Plan:   1. Porokeratosis   2. Plantar flexed metatarsal bone of left foot   3. Preoperative examination     Patient was evaluated and treated and all questions answered.  Left submetatarsal 5 porokeratosis with underlying plantarflexed metatarsal -I explained to the patient the etiology of porokeratosis versus treatment options were discussed.  Given that she has not had any resolve meant as continues the painful with ambulation she would like to proceed with surgery at this time.  I discussed with her in extensive detail my preoperative intraoperative postoperative plan that  includes floating osteotomy of the fifth.  Patient states understanding would like to proceed with the surgery.  She will be weightbearing as tolerated in surgical shoe. -Informed surgical risk consent was reviewed and read aloud to the patient.  I reviewed the films.  I have discussed my findings with the patient in great detail.  I have discussed all risks including but not limited to infection, stiffness, scarring, limp, disability, deformity, damage to blood vessels and nerves, numbness, poor healing, need for braces, arthritis, chronic pain, amputation, death.  All benefits and realistic expectations discussed in great detail.  I have made no promises as to the outcome.  I have provided realistic expectations.  I have offered the patient a 2nd opinion, which they have declined and assured me they preferred to proceed despite the risks   Onychomycosis with pain  -Nails palliatively debrided as below. -Educated on self-care  Procedure: Nail Debridement Rationale: pain  Type of Debridement: manual, sharp debridement. Instrumentation: Nail nipper, rotary burr. Number of Nails: 10  Procedures and Treatment: Consent by patient was obtained for treatment procedures. The patient understood the discussion of treatment and procedures well. All questions were answered thoroughly reviewed. Debridement of mycotic and hypertrophic toenails, 1 through 5 bilateral and clearing of subungual debris. No ulceration, no infection noted.  Return Visit-Office Procedure: Patient instructed to return to the office for a follow up visit 3 months for continued evaluation and treatment.  Nicholes Rough, DPM    No follow-ups on file.

## 2020-12-28 ENCOUNTER — Other Ambulatory Visit: Payer: Self-pay

## 2020-12-28 ENCOUNTER — Encounter: Payer: Self-pay | Admitting: Podiatry

## 2020-12-28 ENCOUNTER — Ambulatory Visit (INDEPENDENT_AMBULATORY_CARE_PROVIDER_SITE_OTHER): Payer: Medicare Other | Admitting: Podiatry

## 2020-12-28 DIAGNOSIS — Q828 Other specified congenital malformations of skin: Secondary | ICD-10-CM | POA: Diagnosis not present

## 2020-12-28 DIAGNOSIS — M216X2 Other acquired deformities of left foot: Secondary | ICD-10-CM

## 2020-12-28 NOTE — Progress Notes (Signed)
  Subjective:  Patient ID: Dana Orozco, female    DOB: 1965-06-18,  MRN: [id number]  Chief Complaint  Patient presents with   Nail Problem    Nail trim    55 y.o. female returns for the above complaint.  Patient presents with follow-up of plantarflexed metatarsal to the left side with submetatarsal 5 porokeratosis.  She would like to have it debrided down.  She is ready for surgery.  She has a scheduled in October.  She denies any other acute complaints she will do it after her trip from [place]  Objective:  There were no vitals filed for this visit. Podiatric Exam: Vascular: dorsalis pedis and posterior tibial pulses are palpable bilateral. Capillary return is immediate. Temperature gradient is WNL. Skin turgor WNL  Sensorium: Normal Semmes Weinstein monofilament test. Normal tactile sensation bilaterally. Nail Exam: Pt has thick disfigured discolored nails with subungual debris noted bilateral entire nail hallux through fifth toenails.  Pain on palpation to the nails. Ulcer Exam: There is no evidence of ulcer or pre-ulcerative changes or infection. Orthopedic Exam: Muscle tone and strength are WNL. No limitations in general ROM. No crepitus or effusions noted. 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 Skin: Left's submetatarsal 5 porokeratosis with underlying plantarflexed metatarsal of the fifth. No infection or ulcers    Assessment & Plan:   1. Porokeratosis   2. Plantar flexed metatarsal bone of left foot      Patient was evaluated and treated and all questions answered.  Left submetatarsal 5 porokeratosis with underlying plantarflexed metatarsal -I explained to the patient the etiology of porokeratosis versus treatment options were discussed.  Given that she has not had any resolve meant as continues the painful with ambulation she would like to proceed with surgery at this time.  I discussed with her in extensive detail my preoperative intraoperative postoperative plan that includes  floating osteotomy of the fifth.  Patient states understanding would like to proceed with the surgery.  She will be weightbearing as tolerated in surgical shoe. -Informed surgical risk consent was reviewed and read aloud to the patient.  I reviewed the films.  I have discussed my findings with the patient in great detail.  I have discussed all risks including but not limited to infection, stiffness, scarring, limp, disability, deformity, damage to blood vessels and nerves, numbness, poor healing, need for braces, arthritis, chronic pain, amputation, death.  All benefits and realistic expectations discussed in great detail.  I have made no promises as to the outcome.  I have provided realistic expectations.  I have offered the patient a 2nd opinion, which they have declined and assured me they preferred to proceed despite the risks    Onychomycosis with pain  -Nails palliatively debrided as below. -Educated on self-care  Procedure: Nail Debridement Rationale: pain  Type of Debridement: manual, sharp debridement. Instrumentation: Nail nipper, rotary burr. Number of Nails: 10  Procedures and Treatment: Consent by patient was obtained for treatment procedures. The patient understood the discussion of treatment and procedures well. All questions were answered thoroughly reviewed. Debridement of mycotic and hypertrophic toenails, 1 through 5 bilateral and clearing of subungual debris. No ulceration, no infection noted.  Return Visit-Office Procedure: Patient instructed to return to the office for a follow up visit 3 months for continued evaluation and treatment.  Nicholes Rough, DPM    No follow-ups on file.

## 2021-02-03 ENCOUNTER — Ambulatory Visit (INDEPENDENT_AMBULATORY_CARE_PROVIDER_SITE_OTHER): Payer: Medicare Other | Admitting: Podiatry

## 2021-02-03 ENCOUNTER — Encounter: Payer: Self-pay | Admitting: Podiatry

## 2021-02-03 ENCOUNTER — Other Ambulatory Visit: Payer: Self-pay

## 2021-02-03 DIAGNOSIS — Q828 Other specified congenital malformations of skin: Secondary | ICD-10-CM

## 2021-02-03 DIAGNOSIS — M79674 Pain in right toe(s): Secondary | ICD-10-CM | POA: Diagnosis not present

## 2021-02-03 DIAGNOSIS — B351 Tinea unguium: Secondary | ICD-10-CM | POA: Diagnosis not present

## 2021-02-03 DIAGNOSIS — M79675 Pain in left toe(s): Secondary | ICD-10-CM | POA: Diagnosis not present

## 2021-02-03 NOTE — Progress Notes (Signed)
  Subjective:  Patient ID: Dana Orozco, female    DOB: 30-May-1966,  MRN: [id number]  Chief Complaint  Patient presents with   Nail Problem    Nail trim  Porokeratosis    55 y.o. female returns for the above complaint.  Patient presents with thickened elongated dystrophic toenails x10.  Patient states that she has not been able to reach down because she has had hip surgery.  She would like for me to debride her toenails.  They are painful to touch.  Painful ambulating.  She states that she also has pain with porokeratosis to the left submetatarsal 5.  She would like to debride down.  She denies any other acute complaints.  Objective:  There were no vitals filed for this visit. Podiatric Exam: Vascular: dorsalis pedis and posterior tibial pulses are palpable bilateral. Capillary return is immediate. Temperature gradient is WNL. Skin turgor WNL  Sensorium: Normal Semmes Weinstein monofilament test. Normal tactile sensation bilaterally. Nail Exam: Pt has thick disfigured discolored nails with subungual debris noted bilateral entire nail hallux through fifth toenails.  Pain on palpation to the nails. Ulcer Exam: There is no evidence of ulcer or pre-ulcerative changes or infection. Orthopedic Exam: Muscle tone and strength are WNL. No limitations in general ROM. No crepitus or effusions noted. 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 Skin: Left's submetatarsal 5 porokeratosis with underlying plantarflexed metatarsal of the fifth. No infection or ulcers    Assessment & Plan:   1. Porokeratosis   2. Pain due to onychomycosis of toenails of both feet      Patient was evaluated and treated and all questions answered.  Left submetatarsal 5 porokeratosis -I explained to the patient the etiology of porokeratosis versus treatment options were discussed.  Given the amount of pain that she is having I believe patient would benefit from aggressive debridement of the lesion followed by excision of the  central nucleated core.  Patient agrees with the plan would like to proceed with that -Using chisel blade to handle the lesion was debrided down to healthy striated tissue no pinpoint bleeding noted no complication noted followed by excision of the central nucleated core. -She will think about surgery sometime in October.  Onychomycosis with pain  -Nails palliatively debrided as below. -Educated on self-care  Procedure: Nail Debridement Rationale: pain  Type of Debridement: manual, sharp debridement. Instrumentation: Nail nipper, rotary burr. Number of Nails: 10  Procedures and Treatment: Consent by patient was obtained for treatment procedures. The patient understood the discussion of treatment and procedures well. All questions were answered thoroughly reviewed. Debridement of mycotic and hypertrophic toenails, 1 through 5 bilateral and clearing of subungual debris. No ulceration, no infection noted.  Return Visit-Office Procedure: Patient instructed to return to the office for a follow up visit 3 months for continued evaluation and treatment.  Nicholes Rough, DPM    No follow-ups on file.

## 2021-02-16 ENCOUNTER — Telehealth: Payer: Self-pay | Admitting: Urology

## 2021-02-16 NOTE — Telephone Encounter (Signed)
DOS 03/13/21  METATARSAL OSTEO LEFT --- 28308   [facility] EFFECTIVE DATE - 06/04/20   PLAN DEDUCTIBLE - $0.00  OUT OF POCKET - $7,550.00 W/ $7,550.00 REMAINING COINSURANCE - 20% COPAY - $0.00   SPOKE WITH Dana Orozco WITH [employer] AND SHE STATED THAT FOR CPT CODE 88502 NO PRIOR AUTH IS REQUIRED.  REF # [id number]

## 2021-03-13 ENCOUNTER — Encounter: Payer: Self-pay | Admitting: Podiatry

## 2021-03-13 ENCOUNTER — Telehealth: Payer: Self-pay | Admitting: *Deleted

## 2021-03-13 ENCOUNTER — Other Ambulatory Visit: Payer: Self-pay | Admitting: Podiatry

## 2021-03-13 DIAGNOSIS — M21542 Acquired clubfoot, left foot: Secondary | ICD-10-CM | POA: Diagnosis not present

## 2021-03-13 MED ORDER — IBUPROFEN 800 MG PO TABS: 800.0000 mg | ORAL_TABLET | Freq: Four times a day (QID) | ORAL | 1 refills | Status: AC | PRN

## 2021-03-13 MED ORDER — OXYCODONE-ACETAMINOPHEN 5-325 MG PO TABS: 1.0000 | ORAL_TABLET | ORAL | 0 refills | Status: DC | PRN

## 2021-03-13 NOTE — Telephone Encounter (Signed)
[facility] pharmacy is requesting a diagnoses code or reason for the prescription of oxycodone-ace,5-325 mg (narcotic C2) is being prescribed to patient for pain,cannot proceed until given.Please advise. Returned the call to pharmacy and explained that the reason for the medication is because of surgery today by Dr Wilburn Cornelia understanding and said ok.

## 2021-03-22 ENCOUNTER — Ambulatory Visit (INDEPENDENT_AMBULATORY_CARE_PROVIDER_SITE_OTHER): Payer: Medicare Other | Admitting: Podiatry

## 2021-03-22 ENCOUNTER — Ambulatory Visit (INDEPENDENT_AMBULATORY_CARE_PROVIDER_SITE_OTHER): Payer: Medicare Other

## 2021-03-22 ENCOUNTER — Other Ambulatory Visit: Payer: Self-pay

## 2021-03-22 DIAGNOSIS — Q828 Other specified congenital malformations of skin: Secondary | ICD-10-CM

## 2021-03-22 DIAGNOSIS — M216X2 Other acquired deformities of left foot: Secondary | ICD-10-CM

## 2021-03-22 DIAGNOSIS — Z9889 Other specified postprocedural states: Secondary | ICD-10-CM

## 2021-03-22 MED ORDER — OXYCODONE-ACETAMINOPHEN 5-325 MG PO TABS: 1.0000 | ORAL_TABLET | ORAL | 0 refills | Status: DC | PRN

## 2021-03-24 NOTE — Progress Notes (Signed)
  Subjective:  Patient ID: Dana Orozco, female    DOB: 02-22-1966,  MRN: [id number]  Chief Complaint  Patient presents with   Routine Post Op    DOS 10.10.22    DOS: 03/13/2021 Procedure: Status post left floating osteotomy fifth metatarsal  55 y.o. female returns for post-op check.  Patient states she is doing well.  No acute complaints.  She denies any other acute issues she is ambulating with a surgical shoe.  Review of Systems: Negative except as noted in the HPI. Denies N/V/F/Ch.  No past medical history on file.  Current Outpatient Medications:    amLODipine (NORVASC) 5 MG tablet, Take 5 mg by mouth daily. , Disp: , Rfl:    hydrochlorothiazide (MICROZIDE) 12.5 MG capsule, Take 12.5 mg by mouth daily. , Disp: , Rfl:    HYDROcodone-acetaminophen (NORCO) 10-325 MG tablet, Take 1 tablet by mouth every 6 (six) hours as needed., Disp: 20 tablet, Rfl: 0   ibuprofen (ADVIL) 800 MG tablet, Take 1 tablet (800 mg total) by mouth every 6 (six) hours as needed., Disp: 60 tablet, Rfl: 1   ibuprofen (ADVIL,MOTRIN) 600 MG tablet, , Disp: , Rfl:    LORazepam (ATIVAN) 0.5 MG tablet, Take 0.5 mg by mouth every 8 (eight) hours as needed. , Disp: , Rfl:    omeprazole (PRILOSEC) 40 MG capsule, Take 40 mg by mouth daily. , Disp: , Rfl:    oxybutynin (DITROPAN XL) 15 MG 24 hr tablet, Take 15 mg by mouth at bedtime. , Disp: , Rfl:    oxyCODONE-acetaminophen (PERCOCET) 5-325 MG tablet, Take 1 tablet by mouth every 4 (four) hours as needed for severe pain., Disp: 30 tablet, Rfl: 0   traMADol (ULTRAM) 50 MG tablet, Take 50 mg by mouth every 6 (six) hours as needed. , Disp: , Rfl:   Social History   Tobacco Use  Smoking Status Former  Smokeless Tobacco Never    Allergies  Allergen Reactions   No Known Allergies    Objective:  There were no vitals filed for this visit. There is no height or weight on file to calculate BMI. Constitutional Well developed. Well nourished.  Vascular Foot warm and  well perfused. Capillary refill normal to all digits.   Neurologic Normal speech. Oriented to person, place, and time. Epicritic sensation to light touch grossly present bilaterally.  Dermatologic Skin healing well without signs of infection. Skin edges well coapted without signs of infection.  Orthopedic: Tenderness to palpation noted about the surgical site.   Radiographs: 3 views of skeletally mature adult left foot: Good Polite correction alignment noted.  Beginning of callus formation noted Assessment:   1. Porokeratosis   2. Plantar flexed metatarsal bone of left foot   3. Status post foot surgery    Plan:  Patient was evaluated and treated and all questions answered.  S/p foot surgery left -Progressing as expected post-operatively. -XR: See below -WB Status: Weightbearing as tolerated in surgical shoe -Sutures: Intact.  No clinical signs of dehiscence noted no complication noted. -Medications: None -Foot redressed.  No follow-ups on file.

## 2021-03-31 ENCOUNTER — Encounter: Payer: Medicare Other | Admitting: Podiatry

## 2021-03-31 ENCOUNTER — Other Ambulatory Visit: Payer: Self-pay

## 2021-03-31 ENCOUNTER — Ambulatory Visit (INDEPENDENT_AMBULATORY_CARE_PROVIDER_SITE_OTHER): Payer: Medicare Other | Admitting: Podiatry

## 2021-03-31 DIAGNOSIS — Z9889 Other specified postprocedural states: Secondary | ICD-10-CM

## 2021-03-31 DIAGNOSIS — Q828 Other specified congenital malformations of skin: Secondary | ICD-10-CM

## 2021-03-31 DIAGNOSIS — M216X2 Other acquired deformities of left foot: Secondary | ICD-10-CM

## 2021-03-31 MED ORDER — OXYCODONE-ACETAMINOPHEN 5-325 MG PO TABS: 1.0000 | ORAL_TABLET | ORAL | 0 refills | Status: DC | PRN

## 2021-03-31 NOTE — Progress Notes (Signed)
  Subjective:  Patient ID: Dana Orozco, female    DOB: January 01, 1966,  MRN: [id number]  Chief Complaint  Patient presents with   Routine Post Op     DOS 03/13/2021 LT 5TH METATARSAL OSTEOTOMY FLOATING    DOS: 03/13/2021 Procedure: Status post left floating osteotomy fifth metatarsal  55 y.o. female returns for post-op check.  Patient states she is doing well.  No acute complaints.  She denies any other acute issues she is ambulating with a surgical shoe.  Review of Systems: Negative except as noted in the HPI. Denies N/V/F/Ch.  No past medical history on file.  Current Outpatient Medications:    amLODipine (NORVASC) 5 MG tablet, Take 5 mg by mouth daily. , Disp: , Rfl:    hydrochlorothiazide (MICROZIDE) 12.5 MG capsule, Take 12.5 mg by mouth daily. , Disp: , Rfl:    HYDROcodone-acetaminophen (NORCO) 10-325 MG tablet, Take 1 tablet by mouth every 6 (six) hours as needed., Disp: 20 tablet, Rfl: 0   ibuprofen (ADVIL) 800 MG tablet, Take 1 tablet (800 mg total) by mouth every 6 (six) hours as needed., Disp: 60 tablet, Rfl: 1   ibuprofen (ADVIL,MOTRIN) 600 MG tablet, , Disp: , Rfl:    LORazepam (ATIVAN) 0.5 MG tablet, Take 0.5 mg by mouth every 8 (eight) hours as needed. , Disp: , Rfl:    omeprazole (PRILOSEC) 40 MG capsule, Take 40 mg by mouth daily. , Disp: , Rfl:    oxybutynin (DITROPAN XL) 15 MG 24 hr tablet, Take 15 mg by mouth at bedtime. , Disp: , Rfl:    oxyCODONE-acetaminophen (PERCOCET) 5-325 MG tablet, Take 1 tablet by mouth every 4 (four) hours as needed for severe pain., Disp: 30 tablet, Rfl: 0   traMADol (ULTRAM) 50 MG tablet, Take 50 mg by mouth every 6 (six) hours as needed. , Disp: , Rfl:   Social History   Tobacco Use  Smoking Status Former  Smokeless Tobacco Never    Allergies  Allergen Reactions   No Known Allergies    Objective:  There were no vitals filed for this visit. There is no height or weight on file to calculate BMI. Constitutional Well  developed. Well nourished.  Vascular Foot warm and well perfused. Capillary refill normal to all digits.   Neurologic Normal speech. Oriented to person, place, and time. Epicritic sensation to light touch grossly present bilaterally.  Dermatologic Skin completely epithelialized.  No clinical signs of dehiscence noted.  Good correction alignment noted.  Reduction of pressure noted to submetatarsal 5 left  Orthopedic: Tenderness to palpation noted about the surgical site.   Radiographs: 3 views of skeletally mature adult left foot: Good Polite correction alignment noted.  Beginning of callus formation noted Assessment:   1. Porokeratosis   2. Plantar flexed metatarsal bone of left foot   3. Status post foot surgery     Plan:  Patient was evaluated and treated and all questions answered.  S/p foot surgery left -Progressing as expected post-operatively. -XR: See below -WB Status: Weightbearing as tolerated in regular shoe -Sutures: Removed no clinical signs of dehiscence noted no complication noted. -Medications: None -I will see her back for final postop follow-up in 8 weeks.  No follow-ups on file.

## 2021-04-05 ENCOUNTER — Encounter: Payer: Medicare Other | Admitting: Podiatry

## 2021-04-12 ENCOUNTER — Telehealth: Payer: Self-pay | Admitting: Podiatry

## 2021-04-12 MED ORDER — OXYCODONE-ACETAMINOPHEN 5-325 MG PO TABS: 1.0000 | ORAL_TABLET | ORAL | 0 refills | Status: AC | PRN

## 2021-04-12 NOTE — Telephone Encounter (Signed)
Patient is requesting a refill of pain medication.  Please advise.

## 2021-04-12 NOTE — Addendum Note (Signed)
Addended by: Nicholes Rough on: 04/12/2021 04:25 PM   Modules accepted: Orders

## 2021-05-26 ENCOUNTER — Ambulatory Visit: Payer: Medicare Other | Admitting: Podiatry

## 2021-06-09 ENCOUNTER — Other Ambulatory Visit: Payer: Self-pay

## 2021-06-09 ENCOUNTER — Ambulatory Visit (INDEPENDENT_AMBULATORY_CARE_PROVIDER_SITE_OTHER): Payer: Medicare Other | Admitting: Podiatry

## 2021-06-09 DIAGNOSIS — B351 Tinea unguium: Secondary | ICD-10-CM | POA: Diagnosis not present

## 2021-06-09 DIAGNOSIS — M79674 Pain in right toe(s): Secondary | ICD-10-CM

## 2021-06-09 DIAGNOSIS — M79675 Pain in left toe(s): Secondary | ICD-10-CM | POA: Diagnosis not present

## 2021-06-14 ENCOUNTER — Encounter: Payer: Self-pay | Admitting: Podiatry

## 2021-06-14 NOTE — Progress Notes (Signed)
°  Subjective:  Patient ID: Dana Orozco, female    DOB: [birth year],  MRN: [id number]  Chief Complaint  Patient presents with   Nail Problem    Nail trim    56 y.o. female returns for the above complaint.  Patient presents with thickened elongated dystrophic toenails x10.  Patient states that she has not been able to reach down because she has had hip surgery.  She would like for me to debride her toenails.  They are painful to touch.  Painful ambulating.  She states that she also has pain with porokeratosis to the left submetatarsal 5.  She would like to debride down.  She denies any other acute complaints.  Objective:  There were no vitals filed for this visit. Podiatric Exam: Vascular: dorsalis pedis and posterior tibial pulses are palpable bilateral. Capillary return is immediate. Temperature gradient is WNL. Skin turgor WNL  Sensorium: Normal Semmes Weinstein monofilament test. Normal tactile sensation bilaterally. Nail Exam: Pt has thick disfigured discolored nails with subungual debris noted bilateral entire nail hallux through fifth toenails.  Pain on palpation to the nails. Ulcer Exam: There is no evidence of ulcer or pre-ulcerative changes or infection. Orthopedic Exam: Muscle tone and strength are WNL. No limitations in general ROM. No crepitus or effusions noted. 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 Skin: Left's submetatarsal 5 porokeratosis with underlying plantarflexed metatarsal of the fifth. No infection or ulcers    Assessment & Plan:   1. Pain due to onychomycosis of toenails of both feet      Patient was evaluated and treated and all questions answered.  Left submetatarsal 5 porokeratosis -I explained to the patient the etiology of porokeratosis versus treatment options were discussed.  Given the amount of pain that she is having I believe patient would benefit from aggressive debridement of the lesion followed by excision of the central nucleated core.  Patient agrees  with the plan would like to proceed with that -Using chisel blade to handle the lesion was debrided down to healthy striated tissue no pinpoint bleeding noted no complication noted followed by excision of the central nucleated core. -She will think about surgery sometime in October.  Onychomycosis with pain  -Nails palliatively debrided as below. -Educated on self-care  Procedure: Nail Debridement Rationale: pain  Type of Debridement: manual, sharp debridement. Instrumentation: Nail nipper, rotary burr. Number of Nails: 10  Procedures and Treatment: Consent by patient was obtained for treatment procedures. The patient understood the discussion of treatment and procedures well. All questions were answered thoroughly reviewed. Debridement of mycotic and hypertrophic toenails, 1 through 5 bilateral and clearing of subungual debris. No ulceration, no infection noted.  Return Visit-Office Procedure: Patient instructed to return to the office for a follow up visit 3 months for continued evaluation and treatment.  Nicholes Rough, DPM    No follow-ups on file.

## 2021-09-20 ENCOUNTER — Ambulatory Visit: Payer: Medicare Other | Admitting: Podiatry

## 2021-09-22 ENCOUNTER — Ambulatory Visit (INDEPENDENT_AMBULATORY_CARE_PROVIDER_SITE_OTHER): Payer: Medicare Other | Admitting: Podiatry

## 2021-09-22 DIAGNOSIS — B351 Tinea unguium: Secondary | ICD-10-CM

## 2021-09-22 DIAGNOSIS — M79674 Pain in right toe(s): Secondary | ICD-10-CM | POA: Diagnosis not present

## 2021-09-22 DIAGNOSIS — M79675 Pain in left toe(s): Secondary | ICD-10-CM | POA: Diagnosis not present

## 2021-09-28 ENCOUNTER — Encounter: Payer: Self-pay | Admitting: Podiatry

## 2021-09-28 NOTE — Progress Notes (Signed)
?  Subjective:  ?Patient ID: Dana Orozco, female    DOB: Oct 03, 1965,  MRN: [id number] ? ?Chief Complaint  ?Patient presents with  ? Nail Problem  ? ?56 y.o. female returns for the above complaint.  Patient presents with thickened elongated dystrophic toenails x10.  Patient states that she has not been able to reach down because she has had hip surgery.  She would like for me to debride her toenails.  They are painful to touch.  Painful ambulating.   ? ?Objective:  ?There were no vitals filed for this visit. ?Podiatric Exam: ?Vascular: dorsalis pedis and posterior tibial pulses are palpable bilateral. Capillary return is immediate. Temperature gradient is WNL. Skin turgor WNL  ?Sensorium: Normal Semmes Weinstein monofilament test. Normal tactile sensation bilaterally. ?Nail Exam: Pt has thick disfigured discolored nails with subungual debris noted bilateral entire nail hallux through fifth toenails.  Pain on palpation to the nails. ?Ulcer Exam: There is no evidence of ulcer or pre-ulcerative changes or infection. ?Orthopedic Exam: Muscle tone and strength are WNL. No limitations in general ROM. No crepitus or effusions noted. 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 ?Skin: Left's submetatarsal 5 porokeratosis with underlying plantarflexed metatarsal of the fifth. No infection or ulcers ? ? ? ?Assessment & Plan:  ? ?1. Pain due to onychomycosis of toenails of both feet   ? ? ? ? ?Patient was evaluated and treated and all questions answered. ? ?Left submetatarsal 5 porokeratosis ?-I explained to the patient the etiology of porokeratosis versus treatment options were discussed.  Given the amount of pain that she is having I believe patient would benefit from aggressive debridement of the lesion followed by excision of the central nucleated core.  Patient agrees with the plan would like to proceed with that ?-Using chisel blade to handle the lesion was debrided down to healthy striated tissue no pinpoint bleeding noted no  complication noted followed by excision of the central nucleated core. ?-She will think about surgery sometime in October. ? ?Onychomycosis with pain  ?-Nails palliatively debrided as below. ?-Educated on self-care ? ?Procedure: Nail Debridement ?Rationale: pain  ?Type of Debridement: manual, sharp debridement. ?Instrumentation: Nail nipper, rotary burr. ?Number of Nails: 10 ? ?Procedures and Treatment: Consent by patient was obtained for treatment procedures. The patient understood the discussion of treatment and procedures well. All questions were answered thoroughly reviewed. Debridement of mycotic and hypertrophic toenails, 1 through 5 bilateral and clearing of subungual debris. No ulceration, no infection noted.  ?Return Visit-Office Procedure: Patient instructed to return to the office for a follow up visit 3 months for continued evaluation and treatment. ? ?Nicholes Rough, DPM ?  ? ?No follow-ups on file. ? ?

## 2022-01-24 ENCOUNTER — Ambulatory Visit (INDEPENDENT_AMBULATORY_CARE_PROVIDER_SITE_OTHER): Payer: Medicare Other | Admitting: Podiatry

## 2022-01-24 DIAGNOSIS — B351 Tinea unguium: Secondary | ICD-10-CM

## 2022-01-24 DIAGNOSIS — M79675 Pain in left toe(s): Secondary | ICD-10-CM

## 2022-01-24 DIAGNOSIS — M79674 Pain in right toe(s): Secondary | ICD-10-CM | POA: Diagnosis not present

## 2022-01-24 NOTE — Progress Notes (Signed)
  Subjective:  Patient ID: Dana Orozco, female    DOB: 11/04/1965,  MRN: [id number]  Chief Complaint  Patient presents with   Callouses     RFC - TRIM / CALLUS REMOVAL   56 y.o. female returns for the above complaint.  Patient presents with thickened elongated dystrophic toenails x10.  She would like another  She is not able to do it herself.  She denies any other acute complaints.  Objective:  There were no vitals filed for this visit. Podiatric Exam: Vascular: dorsalis pedis and posterior tibial pulses are palpable bilateral. Capillary return is immediate. Temperature gradient is WNL. Skin turgor WNL  Sensorium: Normal Semmes Weinstein monofilament test. Normal tactile sensation bilaterally. Nail Exam: Pt has thick disfigured discolored nails with subungual debris noted bilateral entire nail hallux through fifth toenails.  Pain on palpation to the nails. Ulcer Exam: There is no evidence of ulcer or pre-ulcerative changes or infection. Orthopedic Exam: Muscle tone and strength are WNL. No limitations in general ROM. No crepitus or effusions noted. 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 Skin: Left's submetatarsal 5 porokeratosis with underlying plantarflexed metatarsal of the fifth. No infection or ulcers    Assessment & Plan:   No diagnosis found.     Patient was evaluated and treated and all questions answered.  Left submetatarsal 5 porokeratosis -I explained to the patient the etiology of porokeratosis versus treatment options were discussed.  Given the amount of pain that she is having I believe patient would benefit from aggressive debridement of the lesion followed by excision of the central nucleated core.  Patient agrees with the plan would like to proceed with that -Using chisel blade to handle the lesion was debrided down to healthy striated tissue no pinpoint bleeding noted no complication noted followed by excision of the central nucleated core. -She will think about  surgery sometime in October.  Onychomycosis with pain  -Nails palliatively debrided as below. -Educated on self-care  Procedure: Nail Debridement Rationale: pain  Type of Debridement: manual, sharp debridement. Instrumentation: Nail nipper, rotary burr. Number of Nails: 10  Procedures and Treatment: Consent by patient was obtained for treatment procedures. The patient understood the discussion of treatment and procedures well. All questions were answered thoroughly reviewed. Debridement of mycotic and hypertrophic toenails, 1 through 5 bilateral and clearing of subungual debris. No ulceration, no infection noted.  Return Visit-Office Procedure: Patient instructed to return to the office for a follow up visit 3 months for continued evaluation and treatment.  Cosimo Schertzer, DPM    No follow-ups on file. 

## 2022-04-25 ENCOUNTER — Ambulatory Visit (INDEPENDENT_AMBULATORY_CARE_PROVIDER_SITE_OTHER): Payer: Medicare Other | Admitting: Podiatry

## 2022-04-25 DIAGNOSIS — Q828 Other specified congenital malformations of skin: Secondary | ICD-10-CM | POA: Diagnosis not present

## 2022-04-25 NOTE — Progress Notes (Signed)
  Subjective:  Patient ID: Dana Orozco, female    DOB: 10-29-1965,  MRN: [id number]  Chief Complaint  Patient presents with   Callouses     RFC - TRIM / CALLUS REMOVAL   56 y.o. female returns for the above complaint.  Patient presents with thickened elongated dystrophic toenails x10.  She would like another  She is not able to do it herself.  She denies any other acute complaints.  Objective:  There were no vitals filed for this visit. Podiatric Exam: Vascular: dorsalis pedis and posterior tibial pulses are palpable bilateral. Capillary return is immediate. Temperature gradient is WNL. Skin turgor WNL  Sensorium: Normal Semmes Weinstein monofilament test. Normal tactile sensation bilaterally. Nail Exam: Pt has thick disfigured discolored nails with subungual debris noted bilateral entire nail hallux through fifth toenails.  Pain on palpation to the nails. Ulcer Exam: There is no evidence of ulcer or pre-ulcerative changes or infection. Orthopedic Exam: Muscle tone and strength are WNL. No limitations in general ROM. No crepitus or effusions noted. 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 Skin: Left's submetatarsal 5 porokeratosis with underlying plantarflexed metatarsal of the fifth. No infection or ulcers    Assessment & Plan:   No diagnosis found.     Patient was evaluated and treated and all questions answered.  Left submetatarsal 5 porokeratosis -I explained to the patient the etiology of porokeratosis versus treatment options were discussed.  Given the amount of pain that she is having I believe patient would benefit from aggressive debridement of the lesion followed by excision of the central nucleated core.  Patient agrees with the plan would like to proceed with that -Using chisel blade to handle the lesion was debrided down to healthy striated tissue no pinpoint bleeding noted no complication noted followed by excision of the central nucleated core. -She will think about  surgery sometime in October.  Onychomycosis with pain  -Nails palliatively debrided as below. -Educated on self-care  Procedure: Nail Debridement Rationale: pain  Type of Debridement: manual, sharp debridement. Instrumentation: Nail nipper, rotary burr. Number of Nails: 10  Procedures and Treatment: Consent by patient was obtained for treatment procedures. The patient understood the discussion of treatment and procedures well. All questions were answered thoroughly reviewed. Debridement of mycotic and hypertrophic toenails, 1 through 5 bilateral and clearing of subungual debris. No ulceration, no infection noted.  Return Visit-Office Procedure: Patient instructed to return to the office for a follow up visit 3 months for continued evaluation and treatment.  Nicholes Rough, DPM    No follow-ups on file.

## 2022-08-23 ENCOUNTER — Ambulatory Visit: Payer: 59 | Admitting: Podiatry

## 2022-09-20 ENCOUNTER — Ambulatory Visit (INDEPENDENT_AMBULATORY_CARE_PROVIDER_SITE_OTHER): Payer: 59 | Admitting: Podiatry

## 2022-09-20 DIAGNOSIS — Q828 Other specified congenital malformations of skin: Secondary | ICD-10-CM

## 2022-09-20 NOTE — Progress Notes (Signed)
  Subjective:  Patient ID: Dana SANDUSKYemale    DOB: 1966-02-06,  MRN: [id number]  Chief Complaint  Patient presents with   Nail Problem    Nail trim    57 y.o. female returns for the above complaint.  Patient presents with thickened elongated dystrophic toenails x10.  She would like for me to debride down she denies any other acute complaint she would also like for me to debride the porokeratotic lesion down. Objective:  There were no vitals filed for this visit. Podiatric Exam: Vascular: dorsalis pedis and posterior tibial pulses are palpable bilateral. Capillary return is immediate. Temperature gradient is WNL. Skin turgor WNL  Sensorium: Normal Semmes Weinstein monofilament test. Normal tactile sensation bilaterally. Nail Exam: Pt has thick disfigured discolored nails with subungual debris noted bilateral entire nail hallux through fifth toenails.  Pain on palpation to the nails. Ulcer Exam: There is no evidence of ulcer or pre-ulcerative changes or infection. Orthopedic Exam: Muscle tone and strength are WNL. No limitations in general ROM. No crepitus or effusions noted. 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 Skin: Left's submetatarsal 5 porokeratosis with underlying plantarflexed metatarsal of the fifth. No infection or ulcers    Assessment & Plan:   No diagnosis found.     Patient was evaluated and treated and all questions answered.  Left submetatarsal 5 porokeratosis -I explained to the patient the etiology of porokeratosis versus treatment options were discussed.  Given the amount of pain that she is having I believe patient would benefit from aggressive debridement of the lesion followed by excision of the central nucleated core.  Patient agrees with the plan would like to proceed with that -Using chisel blade to handle the lesion was debrided down to healthy striated tissue no pinpoint bleeding noted no complication noted followed by excision of the central nucleated  core. -She will think about surgery sometime in October.  Onychomycosis with pain  -Nails palliatively debrided as below. -Educated on self-care  Procedure: Nail Debridement Rationale: pain  Type of Debridement: manual, sharp debridement. Instrumentation: Nail nipper, rotary burr. Number of Nails: 10  Procedures and Treatment: Consent by patient was obtained for treatment procedures. The patient understood the discussion of treatment and procedures well. All questions were answered thoroughly reviewed. Debridement of mycotic and hypertrophic toenails, 1 through 5 bilateral and clearing of subungual debris. No ulceration, no infection noted.  Return Visit-Office Procedure: Patient instructed to return to the office for a follow up visit 3 months for continued evaluation and treatment.  Nicholes Rough, DPM    No follow-ups on file.

## 2022-11-14 ENCOUNTER — Ambulatory Visit (INDEPENDENT_AMBULATORY_CARE_PROVIDER_SITE_OTHER): Payer: 59 | Admitting: Podiatry

## 2022-11-14 DIAGNOSIS — M79675 Pain in left toe(s): Secondary | ICD-10-CM | POA: Diagnosis not present

## 2022-11-14 DIAGNOSIS — B351 Tinea unguium: Secondary | ICD-10-CM

## 2022-11-14 DIAGNOSIS — M79674 Pain in right toe(s): Secondary | ICD-10-CM | POA: Diagnosis not present

## 2022-11-14 NOTE — Progress Notes (Signed)
  Subjective:  Patient ID: Dana Orozco, female    DOB: [birth year],  MRN: [id number]  Chief Complaint  Patient presents with   RFC    RFC.   57 y.o. female returns for the above complaint.  Patient presents with thickened elongated dystrophic toenails x10.  She would like another  She is not able to do it herself.  She denies any other acute complaints.  Objective:  There were no vitals filed for this visit. Podiatric Exam: Vascular: dorsalis pedis and posterior tibial pulses are palpable bilateral. Capillary return is immediate. Temperature gradient is WNL. Skin turgor WNL  Sensorium: Normal Semmes Weinstein monofilament test. Normal tactile sensation bilaterally. Nail Exam: Pt has thick disfigured discolored nails with subungual debris noted bilateral entire nail hallux through fifth toenails.  Pain on palpation to the nails. Ulcer Exam: There is no evidence of ulcer or pre-ulcerative changes or infection. Orthopedic Exam: Muscle tone and strength are WNL. No limitations in general ROM. No crepitus or effusions noted. 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 Skin: Left's submetatarsal 5 porokeratosis with underlying plantarflexed metatarsal of the fifth. No infection or ulcers    Assessment & Plan:   No diagnosis found.     Patient was evaluated and treated and all questions answered.  Left submetatarsal 5 porokeratosis -I explained to the patient the etiology of porokeratosis versus treatment options were discussed.  Given the amount of pain that she is having I believe patient would benefit from aggressive debridement of the lesion followed by excision of the central nucleated core.  Patient agrees with the plan would like to proceed with that -Using chisel blade to handle the lesion was debrided down to healthy striated tissue no pinpoint bleeding noted no complication noted followed by excision of the central nucleated core. -She will think about surgery sometime in  October.  Onychomycosis with pain  -Nails palliatively debrided as below. -Educated on self-care  Procedure: Nail Debridement Rationale: pain  Type of Debridement: manual, sharp debridement. Instrumentation: Nail nipper, rotary burr. Number of Nails: 10  Procedures and Treatment: Consent by patient was obtained for treatment procedures. The patient understood the discussion of treatment and procedures well. All questions were answered thoroughly reviewed. Debridement of mycotic and hypertrophic toenails, 1 through 5 bilateral and clearing of subungual debris. No ulceration, no infection noted.  Return Visit-Office Procedure: Patient instructed to return to the office for a follow up visit 3 months for continued evaluation and treatment.  Nicholes Rough, DPM    No follow-ups on file.

## 2023-01-18 ENCOUNTER — Ambulatory Visit: Payer: 59 | Admitting: Podiatry

## 2023-02-06 ENCOUNTER — Ambulatory Visit: Payer: 59 | Admitting: Podiatry

## 2023-02-15 ENCOUNTER — Ambulatory Visit (INDEPENDENT_AMBULATORY_CARE_PROVIDER_SITE_OTHER): Payer: 59 | Admitting: Podiatry

## 2023-02-15 DIAGNOSIS — M79675 Pain in left toe(s): Secondary | ICD-10-CM | POA: Diagnosis not present

## 2023-02-15 DIAGNOSIS — B351 Tinea unguium: Secondary | ICD-10-CM | POA: Diagnosis not present

## 2023-02-15 DIAGNOSIS — M79674 Pain in right toe(s): Secondary | ICD-10-CM | POA: Diagnosis not present

## 2023-02-15 NOTE — Progress Notes (Signed)
Subjective:  Patient ID: Dana Orozco, female    DOB: [birth year],  MRN: [id number]  Chief Complaint  Patient presents with   Callouses   57 y.o. female returns for the above complaint.  Patient presents with thickened elongated dystrophic toenails x10.  She would like for me to debride down she denies any other acute complaint she would also like for me to debride the porokeratotic lesion down. Objective:  There were no vitals filed for this visit. Podiatric Exam: Vascular: dorsalis pedis and posterior tibial pulses are palpable bilateral. Capillary return is immediate. Temperature gradient is WNL. Skin turgor WNL  Sensorium: Normal Semmes Weinstein monofilament test. Normal tactile sensation bilaterally. Nail Exam: Pt has thick disfigured discolored nails with subungual debris noted bilateral entire nail hallux through fifth toenails.  Pain on palpation to the nails. Ulcer Exam: There is no evidence of ulcer or pre-ulcerative changes or infection. Orthopedic Exam: Muscle tone and strength are WNL. No limitations in general ROM. No crepitus or effusions noted. 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 Skin: Left's submetatarsal 5 porokeratosis with underlying plantarflexed metatarsal of the fifth. No infection or ulcers    Assessment & Plan:   1. Pain due to onychomycosis of toenails of both feet        Patient was evaluated and treated and all questions answered.  Left submetatarsal 5 porokeratosis -I explained to the patient the etiology of porokeratosis versus treatment options were discussed.  Given the amount of pain that she is having I believe patient would benefit from aggressive debridement of the lesion followed by excision of the central nucleated core.  Patient agrees with the plan would like to proceed with that -Using chisel blade to handle the lesion was debrided down to healthy striated tissue no pinpoint bleeding noted no complication noted followed by excision of the  central nucleated core. -She will think about surgery sometime in October.  Onychomycosis with pain  -Nails palliatively debrided as below. -Educated on self-care  Procedure: Nail Debridement Rationale: pain  Type of Debridement: manual, sharp debridement. Instrumentation: Nail nipper, rotary burr. Number of Nails: 10  Procedures and Treatment: Consent by patient was obtained for treatment procedures. The patient understood the discussion of treatment and procedures well. All questions were answered thoroughly reviewed. Debridement of mycotic and hypertrophic toenails, 1 through 5 bilateral and clearing of subungual debris. No ulceration, no infection noted.  Return Visit-Office Procedure: Patient instructed to return to the office for a follow up visit 3 months for continued evaluation and treatment.  Nicholes Rough, DPM    No follow-ups on file.

## 2023-05-24 ENCOUNTER — Ambulatory Visit (INDEPENDENT_AMBULATORY_CARE_PROVIDER_SITE_OTHER): Payer: 59 | Admitting: Podiatry

## 2023-05-24 DIAGNOSIS — M79675 Pain in left toe(s): Secondary | ICD-10-CM | POA: Diagnosis not present

## 2023-05-24 DIAGNOSIS — M79674 Pain in right toe(s): Secondary | ICD-10-CM | POA: Diagnosis not present

## 2023-05-24 DIAGNOSIS — B351 Tinea unguium: Secondary | ICD-10-CM | POA: Diagnosis not present

## 2023-05-24 NOTE — Progress Notes (Signed)
  Subjective:  Patient ID: Dana Orozco, female    DOB: 12/13/1965,  MRN: [id number]  No chief complaint on file.  57 y.o. female returns for the above complaint.  Patient presents with thickened elongated dystrophic toenails x10.  She would like for me to debride down she denies any other acute complaint she would also like for me to debride the porokeratotic lesion down. Objective:  There were no vitals filed for this visit. Podiatric Exam: Vascular: dorsalis pedis and posterior tibial pulses are palpable bilateral. Capillary return is immediate. Temperature gradient is WNL. Skin turgor WNL  Sensorium: Normal Semmes Weinstein monofilament test. Normal tactile sensation bilaterally. Nail Exam: Pt has thick disfigured discolored nails with subungual debris noted bilateral entire nail hallux through fifth toenails.  Pain on palpation to the nails. Ulcer Exam: There is no evidence of ulcer or pre-ulcerative changes or infection. Orthopedic Exam: Muscle tone and strength are WNL. No limitations in general ROM. No crepitus or effusions noted. 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 Skin: Left's submetatarsal 5 porokeratosis with underlying plantarflexed metatarsal of the fifth. No infection or ulcers    Assessment & Plan:   No diagnosis found.      Patient was evaluated and treated and all questions answered.  Left submetatarsal 5 porokeratosis -I explained to the patient the etiology of porokeratosis versus treatment options were discussed.  Given the amount of pain that she is having I believe patient would benefit from aggressive debridement of the lesion followed by excision of the central nucleated core.  Patient agrees with the plan would like to proceed with that -Using chisel blade to handle the lesion was debrided down to healthy striated tissue no pinpoint bleeding noted no complication noted followed by excision of the central nucleated core. -She will think about surgery sometime in  October.  Onychomycosis with pain  -Nails palliatively debrided as below. -Educated on self-care  Procedure: Nail Debridement Rationale: pain  Type of Debridement: manual, sharp debridement. Instrumentation: Nail nipper, rotary burr. Number of Nails: 10  Procedures and Treatment: Consent by patient was obtained for treatment procedures. The patient understood the discussion of treatment and procedures well. All questions were answered thoroughly reviewed. Debridement of mycotic and hypertrophic toenails, 1 through 5 bilateral and clearing of subungual debris. No ulceration, no infection noted.  Return Visit-Office Procedure: Patient instructed to return to the office for a follow up visit 3 months for continued evaluation and treatment.  Nicholes Rough, DPM    No follow-ups on file.

## 2023-08-21 ENCOUNTER — Encounter: Payer: Self-pay | Admitting: Podiatry

## 2023-08-21 ENCOUNTER — Ambulatory Visit (INDEPENDENT_AMBULATORY_CARE_PROVIDER_SITE_OTHER): Payer: 59 | Admitting: Podiatry

## 2023-08-21 DIAGNOSIS — M79675 Pain in left toe(s): Secondary | ICD-10-CM | POA: Diagnosis not present

## 2023-08-21 DIAGNOSIS — B351 Tinea unguium: Secondary | ICD-10-CM | POA: Diagnosis not present

## 2023-08-21 DIAGNOSIS — M79674 Pain in right toe(s): Secondary | ICD-10-CM

## 2023-08-21 NOTE — Progress Notes (Signed)
  Subjective:  Patient ID: Dana Orozco, female    DOB: 1965/09/15,  MRN: [id number]  Chief Complaint  Patient presents with   routine foot care    Routine foot care and callouses on bilateral feet.   58 y.o. female returns for the above complaint.  Patient presents with thickened elongated dystrophic toenails x10.  She would like for me to debride down she denies any other acute complaint she would also like for me to debride the porokeratotic lesion down. Objective:  There were no vitals filed for this visit. Podiatric Exam: Vascular: dorsalis pedis and posterior tibial pulses are palpable bilateral. Capillary return is immediate. Temperature gradient is WNL. Skin turgor WNL  Sensorium: Normal Semmes Weinstein monofilament test. Normal tactile sensation bilaterally. Nail Exam: Pt has thick disfigured discolored nails with subungual debris noted bilateral entire nail hallux through fifth toenails.  Pain on palpation to the nails. Ulcer Exam: There is no evidence of ulcer or pre-ulcerative changes or infection. Orthopedic Exam: Muscle tone and strength are WNL. No limitations in general ROM. No crepitus or effusions noted. 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 Skin: Left's submetatarsal 5 porokeratosis with underlying plantarflexed metatarsal of the fifth. No infection or ulcers    Assessment & Plan:   No diagnosis found.      Patient was evaluated and treated and all questions answered.  Left submetatarsal 5 porokeratosis -I explained to the patient the etiology of porokeratosis versus treatment options were discussed.  Given the amount of pain that she is having I believe patient would benefit from aggressive debridement of the lesion followed by excision of the central nucleated core.  Patient agrees with the plan would like to proceed with that -Using chisel blade to handle the lesion was debrided down to healthy striated tissue no pinpoint bleeding noted no complication noted  followed by excision of the central nucleated core. -She will think about surgery sometime in October.  Onychomycosis with pain  -Nails palliatively debrided as below. -Educated on self-care  Procedure: Nail Debridement Rationale: pain  Type of Debridement: manual, sharp debridement. Instrumentation: Nail nipper, rotary burr. Number of Nails: 10  Procedures and Treatment: Consent by patient was obtained for treatment procedures. The patient understood the discussion of treatment and procedures well. All questions were answered thoroughly reviewed. Debridement of mycotic and hypertrophic toenails, 1 through 5 bilateral and clearing of subungual debris. No ulceration, no infection noted.  Return Visit-Office Procedure: Patient instructed to return to the office for a follow up visit 3 months for continued evaluation and treatment.  Nicholes Rough, DPM    No follow-ups on file.

## 2023-12-27 ENCOUNTER — Ambulatory Visit: Admitting: Podiatry
